# Patient Record
Sex: Female | Born: 1940 | Race: White
Health system: Southern US, Community
[De-identification: ages and names within clinical notes are randomized; demographics above are authoritative.]

## PROBLEM LIST (undated history)

## (undated) ENCOUNTER — Ambulatory Visit (INDEPENDENT_AMBULATORY_CARE_PROVIDER_SITE_OTHER): Admission: RE | Payer: Self-pay

## (undated) DIAGNOSIS — H04123 Dry eye syndrome of bilateral lacrimal glands: Secondary | ICD-10-CM

## (undated) DIAGNOSIS — M545 Low back pain, unspecified: Secondary | ICD-10-CM

## (undated) DIAGNOSIS — E785 Hyperlipidemia, unspecified: Secondary | ICD-10-CM

## (undated) DIAGNOSIS — M1990 Unspecified osteoarthritis, unspecified site: Secondary | ICD-10-CM

## (undated) DIAGNOSIS — IMO0002 Reserved for concepts with insufficient information to code with codable children: Secondary | ICD-10-CM

## (undated) DIAGNOSIS — R87619 Unspecified abnormal cytological findings in specimens from cervix uteri: Secondary | ICD-10-CM

## (undated) HISTORY — DX: Reserved for concepts with insufficient information to code with codable children: IMO0002

## (undated) HISTORY — DX: Unspecified abnormal cytological findings in specimens from cervix uteri: R87.619

## (undated) HISTORY — DX: Unspecified osteoarthritis, unspecified site: M19.90

## (undated) HISTORY — DX: Hyperlipidemia, unspecified: E78.5

## (undated) HISTORY — DX: Dry eye syndrome of bilateral lacrimal glands: H04.123

## (undated) HISTORY — PX: COLPOSCOPY: SHX161

---

## 2008-12-19 ENCOUNTER — Encounter (INDEPENDENT_AMBULATORY_CARE_PROVIDER_SITE_OTHER): Payer: Self-pay

## 2008-12-26 ENCOUNTER — Ambulatory Visit
Admit: 2008-12-26 | Disposition: A | Discharge status: Home / Self Care | Payer: Self-pay | Source: Ambulatory Visit | Admitting: Surgery

## 2008-12-26 LAB — CBC
Hematocrit: 37 % (ref 37.0–47.0)
Hgb: 12.4 G/DL (ref 12.0–16.0)
MCH: 31.2 PG (ref 28.0–32.0)
MCHC: 33.5 G/DL (ref 32.0–36.0)
MCV: 93.2 FL (ref 80.0–100.0)
MPV: 12 FL (ref 9.4–12.3)
Platelets: 231 /mm3 (ref 140–400)
RBC: 3.97 /mm3 — ABNORMAL LOW (ref 4.20–5.40)
RDW: 12.5 % (ref 11.5–15.0)
WBC: 8.47 /mm3 (ref 3.50–10.80)

## 2008-12-26 LAB — BASIC METABOLIC PANEL
BUN: 19 mg/dL (ref 8–20)
CO2: 29 mEq/L (ref 21–30)
Calcium: 9.5 mg/dL (ref 8.6–10.2)
Chloride: 102 mEq/L (ref 98–107)
Creatinine: 1 mg/dL (ref 0.6–1.5)
Glucose: 80 mg/dL (ref 70–100)
Potassium: 4.7 mEq/L (ref 3.6–5.0)
Sodium: 136 mEq/L (ref 136–146)

## 2008-12-26 LAB — GFR

## 2009-01-04 ENCOUNTER — Ambulatory Visit: Admission: RE | Admit: 2009-01-04 | Payer: Self-pay | Source: Ambulatory Visit | Admitting: Surgery

## 2009-01-04 ENCOUNTER — Encounter (INDEPENDENT_AMBULATORY_CARE_PROVIDER_SITE_OTHER): Payer: Self-pay

## 2009-01-04 HISTORY — PX: BREAST SURGERY: SHX581

## 2011-04-17 ENCOUNTER — Ambulatory Visit (INDEPENDENT_AMBULATORY_CARE_PROVIDER_SITE_OTHER)
Admit: 2011-04-17 | Discharge: 2011-04-17 | Disposition: A | Discharge status: Home / Self Care | Payer: Self-pay | Source: Ambulatory Visit

## 2011-07-12 LAB — ECG 12-LEAD
Atrial Rate: 72 {beats}/min
P Axis: 86 degrees
P-R Interval: 152 ms
Q-T Interval: 368 ms
QRS Duration: 94 ms
QTC Calculation (Bezet): 402 ms
R Axis: 73 degrees
T Axis: 54 degrees
Ventricular Rate: 72 {beats}/min

## 2011-08-08 NOTE — Op Note (Unsigned)
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            INDICATIONS:      The patient presented after undergoing a stereotactic breast biopsy of the      upper and lower left breast.  The biopsy in the upper breast revealed an      atypical ductal hyperplasia.  Options of care were discussed and we decided      to go forward with an excisional breast biopsy due to the chance of      upstaging to malignancy of 10 to 20%.  We discussed placing the guidewire      in the OR with an ultrasound to localize the hematoma cavity that was      present on clinical exam.  The patient was advised that we may not get the      clip as when there is a significant hematoma clips often will migrate and      the hematoma is a more reliable target.  We discussed the procedure in      detail as well as the expected outcome and potential risks.  Specifically      stressed was the possibility of missing an index lesion.            OPERATIVE FINDINGS:      Specimen mammogram did not contain the clip but there was an obvious      hematoma cavity that was partially entered in a fair amount of blood was      aspirated.  I did fully excise the hematoma cavity.            COMPLICATIONS:      None.            ESTIMATED BLOOD LOSS:      Minimal.            DESCRIPTION OF PROCEDURE:      The patient was sedated and  the upper left breast was scanned, hematoma      located, and ultrasound guidance was used to place a wire through it.  The      left breast was now prepped and draped in the usual sterile fashion.  Time      out was done.  The time out was done after draping.  Area around the tip of      the wire was now anesthetized with 1% Xylocaine with epinephrine.  An  incision was made at the areolar border.  Flaps were raised to unroof the      breast tissue around the hematoma cavity, which was now removed completely.       The hematoma was entered cranially and some old blood was aspirated.      Specimen was oriented for pathologic study and sent for mammogram but did      not reveal the clip.  Hemostasis was established with electrocautery.            The wound was irrigated with normal saline.  Palpation of the wound      revealed nothing of concern in the surrounding breast tissue.  Wound was      closed in layers using a 3-0 Vicryl for the subcutaneous layer and a      running 4-0 Monocryl for the skin.  Marcaine was injected for patient      comfort.  Benzoin, Steri-Strips, and dry sterile dressings were applied.      The patient tolerated the procedure well, left the operating room in stable      condition.                              _______________________________     Date/Time Signed: _____________      Randolph Bing MD ([id number])            D:  01/04/2009 13:05 PM by Dr. Elana Alm. Noel Gerold, MD ([id number])      T:  01/04/2009 15:51 PM by [id number]          Everlean Cherry: [id number]) (Doc ID: [id number])                  HQ:IONGE Aron Baba MD      Alecia Lemming MD

## 2012-02-18 ENCOUNTER — Encounter (INDEPENDENT_AMBULATORY_CARE_PROVIDER_SITE_OTHER): Payer: Self-pay | Admitting: Surgery

## 2012-03-31 ENCOUNTER — Encounter (INDEPENDENT_AMBULATORY_CARE_PROVIDER_SITE_OTHER): Payer: Self-pay

## 2012-04-05 ENCOUNTER — Encounter (INDEPENDENT_AMBULATORY_CARE_PROVIDER_SITE_OTHER): Payer: Self-pay

## 2012-04-07 ENCOUNTER — Ambulatory Visit (INDEPENDENT_AMBULATORY_CARE_PROVIDER_SITE_OTHER): Payer: BC Managed Care – PPO | Admitting: Surgical Oncology

## 2012-04-07 ENCOUNTER — Encounter (INDEPENDENT_AMBULATORY_CARE_PROVIDER_SITE_OTHER): Payer: Self-pay | Admitting: Surgical Oncology

## 2012-04-07 VITALS — BP 110/61 | HR 72 | Temp 97.7°F | Ht 63.5 in | Wt 146.0 lb

## 2012-04-07 DIAGNOSIS — N6099 Unspecified benign mammary dysplasia of unspecified breast: Secondary | ICD-10-CM

## 2012-04-07 DIAGNOSIS — N6029 Fibroadenosis of unspecified breast: Secondary | ICD-10-CM

## 2012-04-08 ENCOUNTER — Encounter (INDEPENDENT_AMBULATORY_CARE_PROVIDER_SITE_OTHER): Payer: Self-pay | Admitting: Surgical Oncology

## 2012-04-08 DIAGNOSIS — N6029 Fibroadenosis of unspecified breast: Secondary | ICD-10-CM | POA: Insufficient documentation

## 2012-04-08 DIAGNOSIS — N6099 Unspecified benign mammary dysplasia of unspecified breast: Secondary | ICD-10-CM | POA: Insufficient documentation

## 2012-04-08 NOTE — Progress Notes (Signed)
Subjective:       Patient ID: Carolyn [name] is a 71 y.o. female.    HPI    Ms. Marcell presents for a clinical breast exam and review of her recent imaging studies.   She was diagnosed with atypical ductal hyperplasia at the time of a core biopsy in March 2009.   She had a excisional biopsy which failed to show residual atypia.   She is not interested in adding breast MRI to her surveillance or taking tamoxifen to reduce her breast cancer risk.   There is no family history of breast cancer.     She denies any breast changes or concerns.   She had a bilateral mammogram on 03/31/12 at [facility] which was noted to be stable.     Review of Systems    Significant for occasional night sweats.  Has cataracts.  The patient denies difficulties with fevers, chills, arthralgias, myalgias, appetite changes or change in bowel or bladder function.  She denies headaches, visual disturbances, dyspnea or chest discomfort.  All other symptoms negative        Objective:    Physical Exam     GENERAL:  Well developed female in no acute distress  HEENT:  No scleral icterus is noted.  LYMPHATICS:  There is no palpable cervical, supraclavicular or axillary adenopathy.  BREAST EXAMINATION:  The patient is examined in the upright and supine positions.  Both nipple are everted.  The breast tissue is noted to be mildly dense bilaterally.  There are no palpable masses or areas of focal tenderness.    CHEST:  The chest is clear to auscultation and percussion.  CARDIAC EXAM:  There is a regular rate and rhythm without murmurs, rubs or gallops.  ABDOMEN:  The abdomen is without distention and is nontender.  There is no palpable hepatosplenomegaly.   EXTREMITIES: Full range of motion of all extremities.  No evidence of  erythema or edema.     Radiologic Studies:  Her mammogram films were reviewed.  The breast tissue was noted to be heterogeneously dense mammographically.  Benign appearing calcifications noted.   The films were reviewed with the patient  while in the office today.          Assessment:       Atypical ductal hyperplasia  Dense breast tissue       Plan:       There are suspicious findings on her clinical exam or mammogram.   She will continue with her own self breast exam and call if she notes a change.  Otherwise, she will return in one year for an exam and mammogram.

## 2012-04-28 ENCOUNTER — Ambulatory Visit: Payer: BC Managed Care – PPO | Admitting: Obstetrics & Gynecology

## 2012-04-28 ENCOUNTER — Encounter: Payer: Self-pay | Admitting: Obstetrics & Gynecology

## 2012-04-28 VITALS — BP 124/68 | HR 69 | Ht 63.0 in | Wt 146.0 lb

## 2012-04-28 DIAGNOSIS — Z01419 Encounter for gynecological examination (general) (routine) without abnormal findings: Secondary | ICD-10-CM

## 2012-04-28 MED ORDER — ESTRADIOL-NORETHINDRONE ACET 0.5-0.1 MG PO TABS
1.00 | ORAL_TABLET | Freq: Every day | ORAL | Status: DC
Start: 2012-04-28 — End: 2013-04-19

## 2012-04-28 NOTE — Progress Notes (Signed)
Subjective:       Carolyn [name] is a 71 y.o. female here for a routine exam.  Current complaints: none.  Personal health questionnaire reviewed: yes.     Gynecologic History  Patient's last menstrual period was 01/19/1999.  Contraception: post menopausal status  Last Pap: 03/06/2011. Results were: normal, HPV neg  Last mammogram: 03/2012. Results were: normal (ordered by her breast surgeon Dr Noel Gerold)    Obstetric History  OB History     Grav Para Term Preterm Abortions TAB SAB Ect Mult Living    1 1               # Outc Date GA Lbr Len/2nd Wgt Sex Del Anes PTL Lv    1 PAR 9/76     SVD               The following portions of the patient's history were reviewed and updated as appropriate: allergies, current medications, past family history, past medical history, past social history, past surgical history and problem list.    Review of Systems  A comprehensive review of systems was negative except for: Musculoskeletal: positive for osteoarthritis left knee and cataract in her left eye that she plans surgery for in 05/2012     Objective:        BP 124/68  Pulse 69  Ht 1.6 m (5\' 3" )  Wt 66.225 kg (146 lb)  BMI 25.86 kg/m2  LMP 01/19/1999    General Appearance:  Alert, cooperative, no distress, appears stated age   Head:  Normocephalic, without obvious abnormality, atraumatic   Eyes:  PERRL, conjunctiva/corneas clear, EOM's intact        Nose: Nares normal, septum midline,mucosa normal, no drainage or sinus tenderness   Throat: Lips, mucosa, and tongue normal; teeth and gums normal   Neck: Supple, symmetrical, trachea midline, no adenopathy;  thyroid: not enlarged, symmetric, no tenderness/mass/nodules   Back:   Symmetric, no curvature, ROM normal, no CVA tenderness   Lungs:   Clear to auscultation bilaterally, respirations unlabored   Breasts:  No masses or tenderness   Heart:  Regular rate and rhythm, S1 and S2 normal, no murmur, rub, or gallop   Abdomen:   Soft, non-tender, bowel sounds active all four quadrants,  no  masses, no organomegaly       Extremities: Extremities normal, atraumatic, no cyanosis or edema       Skin: Skin color, texture, turgor normal, no rashes or lesions   Lymph nodes: Cervical, supraclavicular, and axillary nodes normal   Neurologic: Normal     Pelvic: External genitalia: normal general appearance  Vaginal: normal without tenderness, induration or masses  Cervix: normal appearance  Adnexa: normal bimanual exam and no palpable masses  Uterus: normal size nontender     Patient reports normal colonoscopy 2 months ago, no polyps  GI advised her to return in 5 years for repeat colonoscopy          Assessment:      Healthy female exam.      Plan:      Mammogram ordered.  Follow up in: 1 year.

## 2012-05-01 LAB — PAP IG, RFX HPV ASCU: .: 0

## 2012-10-20 HISTORY — PX: COLONOSCOPY, DIAGNOSTIC (SCREENING): SHX174

## 2013-04-01 ENCOUNTER — Telehealth (INDEPENDENT_AMBULATORY_CARE_PROVIDER_SITE_OTHER): Payer: Self-pay

## 2013-04-01 NOTE — Telephone Encounter (Signed)
04/01/13- Called pt and left a voicemail. Due for f/u appt and imaging with Dr. Cohen. SB

## 2013-04-14 ENCOUNTER — Encounter (INDEPENDENT_AMBULATORY_CARE_PROVIDER_SITE_OTHER): Payer: Self-pay

## 2013-04-19 ENCOUNTER — Other Ambulatory Visit: Payer: Self-pay | Admitting: Obstetrics & Gynecology

## 2013-04-28 ENCOUNTER — Ambulatory Visit: Payer: BC Managed Care – PPO | Admitting: Obstetrics & Gynecology

## 2013-04-28 ENCOUNTER — Encounter: Payer: Self-pay | Admitting: Obstetrics & Gynecology

## 2013-04-28 VITALS — BP 124/68 | Ht 64.0 in | Wt 148.0 lb

## 2013-04-28 DIAGNOSIS — Z01419 Encounter for gynecological examination (general) (routine) without abnormal findings: Secondary | ICD-10-CM

## 2013-04-28 MED ORDER — ESTRADIOL-NORETHINDRONE ACET 0.5-0.1 MG PO TABS
1.0000 | ORAL_TABLET | Freq: Every day | ORAL | Status: DC
Start: 2013-04-28 — End: 2014-05-11

## 2013-04-28 MED ORDER — OXYBUTYNIN CHLORIDE ER 10 MG PO TB24
10.0000 mg | ORAL_TABLET | Freq: Every day | ORAL | Status: DC
Start: 2013-04-28 — End: 2014-05-24

## 2013-04-28 NOTE — Progress Notes (Signed)
Subjective:       Carolyn [name] is a 72 y.o. female here for a routine exam.  Current complaints: still has urge incontinence but is much better with Detrol. She has noticed dry mouth and itching scalp and wants to try an alternative medication. Will try Ditropan XL 10 mg daily.  Personal health questionnaire reviewed: yes.     Gynecologic History  Patient's last menstrual period was 01/19/1999.  Contraception: post menopausal status  Last Pap: 03/2012. Results were: normal  Last mammogram: 03/2013. Results were: normal by patient's report (orders are from Dr Noel Gerold due to history of atypical ductal hyperplasia)    Obstetric History  OB History     Grav Para Term Preterm Abortions TAB SAB Ect Mult Living    1 1               # Outc Date GA Lbr Len/2nd Wgt Sex Del Anes PTL Lv    1 PAR 9/76     SVD               The following portions of the patient's history were reviewed and updated as appropriate: allergies, current medications, past family history, past medical history, past social history, past surgical history and problem list.    Review of Systems  A comprehensive review of systems was negative. No new medical problems.     Objective:      124/68, 148 lbs     General appearance: alert, appears stated age and cooperative  Neck: no adenopathy, supple, symmetrical, trachea midline and thyroid not enlarged, symmetric, no tenderness/mass/nodules  Lungs: clear to auscultation bilaterally  Breasts: normal appearance, no masses or tenderness, No nipple retraction or dimpling  Heart: regular rate and rhythm  Abdomen: soft, non-tender; bowel sounds normal; no masses,  no organomegaly  Extremities: extremities normal, atraumatic, no cyanosis or edema  Pelvic Exam:   Vulva:  normal   Vagina: normal vagina, no discharge, exudate, lesion, or erythema   Cervix:  no cervical motion tenderness and no lesions   Corpus: normal size, contour, position, consistency, mobility, non-tender   Adnexa:  no mass, fullness, tenderness        TP Pap performed  Had colonoscopy last year  Discussed ACOG recommendations for Paps, She would like to continue with Paps annually as long as her insurance will cover it.          Assessment:      Healthy female exam.      Plan:      Follow up in: 1 year.  Mammogram orders per Dr Noel Gerold

## 2013-05-02 LAB — PAP IG, RFX HPV ASCU: .: 0

## 2013-05-16 ENCOUNTER — Other Ambulatory Visit: Payer: Self-pay | Admitting: Obstetrics & Gynecology

## 2013-05-23 ENCOUNTER — Ambulatory Visit (INDEPENDENT_AMBULATORY_CARE_PROVIDER_SITE_OTHER): Payer: BC Managed Care – PPO | Admitting: Surgery

## 2013-05-23 ENCOUNTER — Encounter (INDEPENDENT_AMBULATORY_CARE_PROVIDER_SITE_OTHER): Payer: Self-pay | Admitting: Surgery

## 2013-05-23 VITALS — BP 137/61 | HR 81 | Temp 98.5°F | Wt 148.0 lb

## 2013-05-23 DIAGNOSIS — N6092 Unspecified benign mammary dysplasia of left breast: Secondary | ICD-10-CM

## 2013-05-23 NOTE — Progress Notes (Signed)
HPI:  This 72 y.o. female returns for breast cancer screening because of a previous biopsy revealing atypical ductal hyperplasia.  Carolyn [name] was initially seen in 03/10 for atypical ductal hyperplasia noted on a stereotactic breast biopsy done for calcifications I did an excisional breast biopsy that reveal no evidence of malignancy.  We opted to follow up with yearly mammogram with tomosynthesis and clinical breast exam. No change in breast self exam is reported.  Specifically denied are: new masses, focal pain, nipple discharge, bleeding from the nipple and skin changes.  Last office visit of 06/13 was reviewed and revealed:  Nothing of concern   Mammogram of 06/14 was reviewed and revealed: BIRADS category 2 benign changes                          PHYSICAL EXAM:                    WDWN female in NAD  HEENT:  Clear, no scleral icterus, neck supple  Neck:  No thyromegaly or masses, trachea midline  Chest:  Clear to auscultation, respiratory effort normal  CV:  RRR without murmurs or gallops, no pedal edema  Abd:  No tenderness, organomegaly or masses  BJM:  Gait and station normal  Ext:  No CCE  Skin:  Free of significant ulcers or lesions  Neuro:  Grossly intact, no focal findings, alert and oriented, asks appropriate questions  LN:  No axillary, supraclavicular or cervical adenopathy  Breast:  Symmetric, without dominant masses, focal tenderness, skin changes or nipple discharge.           ASSESSMENT:  Clinical breast exam and mammogram reveal nothing of concern.          PLAN:  Yearly mammogram with tomosynthesis and breast cancer surveillance.      FOLLOW UP:  Return for clinical breast exam and review of mammogram in 1 year.

## 2013-09-22 ENCOUNTER — Encounter: Payer: Self-pay | Admitting: Obstetrics & Gynecology

## 2013-09-22 ENCOUNTER — Ambulatory Visit: Payer: BC Managed Care – PPO | Admitting: Obstetrics & Gynecology

## 2013-09-22 VITALS — BP 132/66

## 2013-09-22 DIAGNOSIS — N950 Postmenopausal bleeding: Secondary | ICD-10-CM

## 2013-09-22 NOTE — Progress Notes (Signed)
Gyn Office Visit Note    Date September 22, 2013  Patient Name: Carolyn [name]  Attending Physician: Dr Brien Few    Subjective:     72 yo female patient who presents with postmenopausal bleeding. She is on HRT and her medications have not changed. She has not had any spotting or bleeding in the past. She reports that about 1 week ago before Thanksgiving, she had brown spotting and some RLQ "twinges." She denies any significant abd or pelvic pain. She states that 3 days ago she noticed red bleeding that required a minipad but not heavy. Today she has brown spotting.     ROS: positive for abd discomfort "I thought it was my gallbladder, " she had a GI workup including endoscopy and upper GI. Findings were normal except for a small hiatal hernia by her report.     Medications:         Ditropan XL  Activella 0.5/1  Arthrotec  Simvastatin      Objective:     Filed Vitals:    09/22/13 1321   BP: 132/66       Abdomen: Abdomen soft, non-tender. BS normal. No masses,  No organomegaly  Procedure      The endometrial biopsy procedure with associated risks and benefits were discussed with the patient.  Consent was obtained.     The patient was placed in the dorsal lithotomy position with legs placed in the stirrups. After a bimanual examination to assess the uterine size and position, a vaginal speculum was placed in the vagina providing adequate visualization of the cervix. The cervix was cleaned and prepped with betadine in the normal sterile fashion. No cervical lesions were visualized. Brown blood was noted from os. No Allis clamp was required for traction. The Milex EMB device was guided through the cervical os and to the uterine fundus without difficulty. The uterus was sounded to 7 cm. Syringe suction was applied and the biopsy sample was obtained with gentle rotation and extraction of the device from the uterus.  A small amount of tissue was collected. Excellent hemostasis was noted at completion of procedure. No  complications were noted. EBL was minimal.   The patient tolerated the procedure well. The patient was counseled regarding post-procedure precautions. The patient was advised to return for bleeding similar to a period for 2 or more days, purulent/malodorous vaginal discharge, temperature >100.78F, or abdominal pain not controlled with OTC medications. The patient will be contacted upon receipt of her pathology results.       Labs  EMB to pathology    Radiologic Studies  [facility] order for pelvic sonogram given to patient    Assessment:     Patient Active Problem List   Diagnosis   . Benign mammary dysplasia, unspecified   . Fibroadenosis of breast   Pmp bleeding     Plan:     72 yo female patient who presents with pmp bleeding on HRT  EMB performed  Pelvic sonogram ordered  Will call patient with results.

## 2013-09-27 ENCOUNTER — Encounter: Payer: Self-pay | Admitting: Obstetrics & Gynecology

## 2013-09-27 LAB — TISSUE EXAM

## 2013-09-27 LAB — US PELVIS WITH TRANSVAGINAL

## 2013-09-28 ENCOUNTER — Telehealth: Payer: Self-pay | Admitting: Obstetrics & Gynecology

## 2013-09-28 MED ORDER — PROGESTERONE MICRONIZED 200 MG PO CAPS
200.0000 mg | ORAL_CAPSULE | Freq: Every evening | ORAL | Status: AC
Start: 2013-09-28 — End: 2013-10-12

## 2013-09-28 NOTE — Progress Notes (Signed)
Quick Note:    Patient informed that her endometrial biopsy is benign. She she is still spotting, will treat "weakly proliferative endometrium" with a short course of progesterone. She is on HRT and she is aware that she may need to discontinue HRT if bleeding persists or consider a hysteroscopy. She will call back if the progesterone course does not resolve the spotting.  ______

## 2013-09-28 NOTE — Telephone Encounter (Signed)
Patient informed of her EMB and pelvic sono results on 10/08/2013. Since she is still having spotting on HRT, will try a short course of progesterone (EMB was weakly proliferative). If no effect she needs to consider discontinuing HRT or hysteroscopy as was discussed with her today.

## 2013-09-28 NOTE — Progress Notes (Signed)
Quick Note:    Patient informed on 10/08/2013 that she has 2 small fibroids and normal endometrial thickness.  ______

## 2013-10-10 ENCOUNTER — Telehealth: Payer: Self-pay

## 2013-10-10 NOTE — Telephone Encounter (Signed)
Patient called stating she is still spotting and would like to proceed with the hysteroscopy. Scheduled for 10/21/13.

## 2013-10-10 NOTE — Telephone Encounter (Signed)
She will need to sign consent, have blood work drawn (CBC, CMP, TSH abnormal reflex), and she will need an EKG with her primary MD.  Then she will need preop instructions

## 2013-10-11 NOTE — Telephone Encounter (Signed)
Patient notified. She will come 10/14/13 for blood work, sign consent and pick up pre-op packet. She is calling her PCP right now to schedule the EKG.

## 2013-10-19 ENCOUNTER — Encounter: Payer: Self-pay | Admitting: Obstetrics & Gynecology

## 2013-10-20 ENCOUNTER — Encounter: Payer: Self-pay | Admitting: Obstetrics & Gynecology

## 2013-10-20 HISTORY — PX: DILATION AND CURETTAGE OF UTERUS: SHX78

## 2013-10-21 ENCOUNTER — Ambulatory Visit: Payer: Self-pay

## 2013-10-21 ENCOUNTER — Inpatient Hospital Stay
Admission: RE | Admit: 2013-10-21 | Discharge: 2013-10-21 | Disposition: A | Discharge status: Home / Self Care | Payer: BC Managed Care – PPO | Source: Ambulatory Visit | Attending: Obstetrics & Gynecology | Admitting: Obstetrics & Gynecology

## 2013-10-21 DIAGNOSIS — N950 Postmenopausal bleeding: Secondary | ICD-10-CM

## 2013-10-26 NOTE — Progress Notes (Signed)
Provera 5mg  1poqd #90 w/ 0 Refills called in to [facility] [phone]

## 2014-03-05 ENCOUNTER — Encounter: Payer: Self-pay | Admitting: Obstetrics & Gynecology

## 2014-05-02 ENCOUNTER — Encounter: Payer: Self-pay | Admitting: Obstetrics & Gynecology

## 2014-05-03 ENCOUNTER — Ambulatory Visit: Payer: BC Managed Care – PPO | Admitting: Obstetrics & Gynecology

## 2014-05-03 ENCOUNTER — Encounter: Payer: Self-pay | Admitting: Obstetrics & Gynecology

## 2014-05-03 VITALS — BP 130/74 | Wt 147.0 lb

## 2014-05-03 DIAGNOSIS — Z01419 Encounter for gynecological examination (general) (routine) without abnormal findings: Secondary | ICD-10-CM

## 2014-05-03 NOTE — Progress Notes (Signed)
Subjective:       Carolyn [name] is a 73 y.o. female here for a routine exam.  Current complaints: none.  Personal health questionnaire reviewed: yes.     Gynecologic History  Patient's last menstrual period was 01/19/1999.  Contraception: post menopausal status  Last Pap: 04/2013. Results were: normal  Last mammogram: 05/2013. Results were: normal   She has a history of left breast biopsy for atypical ductal hyperplasia and she sees breast surgeon Dr Noel Gerold. He orders her breast imaging studies.     Obstetric History  OB History   Gravida Para Term Preterm AB SAB TAB Ectopic Multiple Living   1 1              # Outcome Date GA Lbr Len/2nd Weight Sex Delivery Anes PTL Lv   1 Para 06/24/75     Vag-Spont               The following portions of the patient's history were reviewed and updated as appropriate: allergies, current medications, past family history, past medical history, past social history, past surgical history and problem list.    Review of Systems  A comprehensive review of systems was negative except for: Musculoskeletal: positive for lumbar back and hip pain that has been treated with cortisone injections but she does not feel that this is working. She is seeing her spine doctor for follow up Dr Loreta Ave. And Positive for GI: she had an EGD within the last year after 2 bouts of "extreme nausea" and was told that the findings were for gastritis      Objective:   LMP 01/19/1999, BP 130/74, 147 lbs        General appearance: alert, appears stated age and cooperative  Neck: no adenopathy, supple, symmetrical, trachea midline and thyroid not enlarged, symmetric, no tenderness/mass/nodules  Lungs: clear to auscultation bilaterally  Breasts: normal appearance, no masses or tenderness, No nipple retraction or dimpling  Heart: regular rate and rhythm  Abdomen: soft, non-tender; bowel sounds normal; no masses,  no organomegaly  Extremities: extremities normal, atraumatic, no cyanosis or edema  Pelvic Exam:   Vulva:   normal   Vagina: normal vagina, no discharge, exudate, lesion, or erythema   Cervix:  no cervical motion tenderness and no lesions   Corpus: normal size, contour, position, consistency, mobility, non-tender   Adnexa:  no mass, fullness, tenderness       TP Pap performed  Patient has recent history of postmenopausal bleeding within last year with 10/2013 D&C for benign cells.             Assessment:      Healthy female exam. Risks of HRT reviewed with patient including but not limited to thromboembolic, cardiac, and cerebrovascular events, or hormone sensitive cancers that may be life threatening. She plans to continue HRT despite these risks. Her questions were answered.      Plan:      Mammogram ordered.  Follow up in: 1 year.

## 2014-05-05 LAB — PAP IG, HPV-HR PROFILE
.: 0
HPV, high-risk: NEGATIVE

## 2014-05-08 NOTE — Progress Notes (Signed)
Quick Note:    Pap postcard mailed to patient.  ______

## 2014-05-11 ENCOUNTER — Other Ambulatory Visit: Payer: Self-pay | Admitting: Obstetrics & Gynecology

## 2014-05-24 ENCOUNTER — Encounter (INDEPENDENT_AMBULATORY_CARE_PROVIDER_SITE_OTHER): Payer: Self-pay | Admitting: Surgery

## 2014-05-24 ENCOUNTER — Ambulatory Visit (INDEPENDENT_AMBULATORY_CARE_PROVIDER_SITE_OTHER): Payer: BC Managed Care – PPO | Admitting: Adult Health

## 2014-05-24 ENCOUNTER — Ambulatory Visit (INDEPENDENT_AMBULATORY_CARE_PROVIDER_SITE_OTHER): Payer: BC Managed Care – PPO | Admitting: Surgery

## 2014-05-24 VITALS — BP 123/60 | HR 68 | Temp 98.8°F | Wt 145.0 lb

## 2014-05-24 DIAGNOSIS — N6099 Unspecified benign mammary dysplasia of unspecified breast: Secondary | ICD-10-CM

## 2014-05-24 DIAGNOSIS — N6089 Other benign mammary dysplasias of unspecified breast: Secondary | ICD-10-CM

## 2014-05-24 NOTE — Progress Notes (Signed)
HPI:  This 73 y.o. female returns for breast cancer screening because of a previous biopsy revealing atypical ductal hyperplasia.  Carolyn [name] was initially seen in 03/10 for atypical ductal hyperplasia noted on a stereotactic breast biopsy done for calcifications.  I did an excisional breast biopsy that reveal no evidence of malignancy.  We opted to follow up with yearly mammogram with tomosynthesis and clinical breast exam. No change in breast self exam is reported.  Specifically denied are: new masses, focal pain, nipple discharge, bleeding from the nipple and skin changes.  Last office visit of 08/14 was reviewed and revealed:  Nothing of concern   Mammogram of 07/15 was reviewed and revealed: BIRADS category 2 benign changes                          PHYSICAL EXAM:                    WDWN female in NAD  HEENT:  Clear, no scleral icterus, neck supple  Neck:  No thyromegaly or masses, trachea midline  Chest:  Clear to auscultation, respiratory effort normal  CV:  RRR without murmurs or gallops, no pedal edema  Abd:  No tenderness, organomegaly or masses  BJM:  Gait and station normal  Ext:  No CCE  Skin:  Free of significant ulcers or lesions  Neuro:  Grossly intact, no focal findings, alert and oriented, asks appropriate questions  LN:  No axillary, supraclavicular or cervical adenopathy  Breast:  Examined in upright and supine positions.  Both nipples everted.  Symmetric, without dominant masses, focal tenderness, skin changes or nipple discharge. Mild tenderness to the lower inner left breast.          ASSESSMENT:  Clinical breast exam and mammogram reveal nothing of concern.          PLAN:  Yearly mammogram with tomosynthesis and breast cancer surveillance.      FOLLOW UP:  Return for clinical breast exam and review of mammogram in 1 year.

## 2014-05-24 NOTE — Progress Notes (Signed)
Have you sought care outside of [place] since we last saw you?   Yes

## 2014-07-05 ENCOUNTER — Ambulatory Visit: Payer: BC Managed Care – PPO | Admitting: Obstetrics & Gynecology

## 2014-07-05 VITALS — BP 134/82 | Ht 64.0 in | Wt 144.0 lb

## 2014-07-05 DIAGNOSIS — R1900 Intra-abdominal and pelvic swelling, mass and lump, unspecified site: Secondary | ICD-10-CM

## 2014-07-05 HISTORY — DX: Intra-abdominal and pelvic swelling, mass and lump, unspecified site: R19.00

## 2014-07-05 NOTE — Progress Notes (Signed)
Gyn Office Visit Note    Date July 05, 2014  Patient Name: Carolyn [name]  Attending Physician: Dr Brien Few    Subjective:     73 yo female patient who presents to office to discuss pelvic finding noted on MRI performed to evaluate her left hip.  ROS: + for dyspepsia - has seen GI and had colonoscopy, EGD and barium testing that were all normal by her report other than gastritis, had D&C 10/2013 for postmenopausal bleeding with normal pathology - inactive endometrium and normal endocervical cells with fibrotic mullerian polyp noted.      Medications:        Celebrex  Mimvey  Zocor  Detrol LA           Objective:     Filed Vitals:    07/05/14 1406   BP: 134/82     15 min of face to face contact discussing MRI findings   She had an MRI performed to evaluate her left hip and pelvic findings included small fibroids that were not measured on the report, and a 2cm left pelvic finding that was homogeneous and non-nodular adjacent to sigmoid colon. It is unclear where this 2cm finding originates from although it does have benign features. Since she had a normal pelvic US at [facility] 09/2013 with small fibroids noted, she will have another pelvic US performed there to see if we can delineate where this noted structure is arising from. She does not have any pelvic symptoms other than the postmenopausal bleeding in 10/2013 that was evaluated by hysteroscopy D&C.     Radiologic Studies  [facility] pelvic US ordered    Assessment:     73 yo female patient with 2cm left sided pelvic mass noted on MRI - unclear if ovarian/uterine or other etiology  Pelvic US ordered  Patient Active Problem List   Diagnosis   . Benign mammary dysplasia, unspecified   . Fibroadenosis of breast   . Pelvic mass in female        Plan:     See above

## 2014-07-10 LAB — US PELVIS WITH TRANSVAGINAL

## 2014-07-14 ENCOUNTER — Telehealth: Payer: Self-pay | Admitting: Obstetrics & Gynecology

## 2014-07-14 NOTE — Telephone Encounter (Signed)
Discussed with patient by telephone that her pelvic US did not adequately see her left ovary. Since her 02/2014 MRI commented on a structure near her left adnexa and left sigmoid, will plan on repeat abd/pelvic MRI in 6 months to re-assess the area seen on prior MRI. Patient aware and we will send her the MRI order form.

## 2014-09-05 ENCOUNTER — Ambulatory Visit: Payer: BC Managed Care – PPO | Attending: Obstetrics & Gynecology

## 2014-09-05 ENCOUNTER — Other Ambulatory Visit: Payer: Self-pay | Admitting: Obstetrics & Gynecology

## 2014-09-05 DIAGNOSIS — R1904 Left lower quadrant abdominal swelling, mass and lump: Secondary | ICD-10-CM

## 2014-09-05 DIAGNOSIS — N898 Other specified noninflammatory disorders of vagina: Secondary | ICD-10-CM | POA: Insufficient documentation

## 2014-09-05 DIAGNOSIS — K6289 Other specified diseases of anus and rectum: Secondary | ICD-10-CM | POA: Insufficient documentation

## 2014-09-05 DIAGNOSIS — D250 Submucous leiomyoma of uterus: Secondary | ICD-10-CM | POA: Insufficient documentation

## 2014-09-05 MED ORDER — GADOBUTROL 1 MMOL/ML IV SOLN
7.0000 mL | Freq: Once | INTRAVENOUS | Status: AC | PRN
Start: 2014-09-05 — End: 2014-09-05
  Administered 2014-09-05: 7 mmol via INTRAVENOUS
  Filled 2014-09-05: qty 7.5

## 2015-05-30 ENCOUNTER — Ambulatory Visit (INDEPENDENT_AMBULATORY_CARE_PROVIDER_SITE_OTHER): Payer: BC Managed Care – PPO | Admitting: Surgery

## 2015-05-30 ENCOUNTER — Encounter (INDEPENDENT_AMBULATORY_CARE_PROVIDER_SITE_OTHER): Payer: Self-pay | Admitting: Surgery

## 2015-05-30 VITALS — BP 128/60 | HR 77 | Temp 97.9°F | Wt 144.4 lb

## 2015-05-30 DIAGNOSIS — N6092 Unspecified benign mammary dysplasia of left breast: Secondary | ICD-10-CM | POA: Insufficient documentation

## 2015-05-30 NOTE — Progress Notes (Signed)
HPI:  This 74 y.o. 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  Lisaann Atha was initially seen in 03/10 for atypical ductal hyperplasia noted on a right stereotactic breast biopsy done for calcifications.  I did an excisional right breast biopsy that reveal no evidence of atypia or malignancy.  We opted to follow up with yearly mammogram with tomosynthesis and clinical breast exam. No change in breast self exam is reported.  Specifically denied are: new masses, focal pain, nipple discharge, bleeding from the nipple and skin changes.  Last office visit of 05/24/14 was reviewed and revealed:  Nothing of concern   The bilateral diagnostic mammogram with tomosynthesis done at [facility] on 05/15/15 was reviewed and revealed: BIRADS category 2 benign changes                          PHYSICAL EXAM:                    WDWN female in NAD  HEENT:  Clear, no scleral icterus, neck supple  Neck:  No thyromegaly or masses, trachea midline  Chest:  Clear to auscultation, respiratory effort normal  CV:  RRR without murmurs or gallops, no pedal edema  Abd:  No tenderness, organomegaly or masses  BJM:  Gait and station normal  Ext:  No CCE  Skin:  Free of significant ulcers or lesions  Neuro:  Grossly intact, no focal findings, alert and oriented, asks appropriate questions  LN:  No axillary, supraclavicular or cervical adenopathy  Breast:  Examined in upright and supine positions.  Both nipples everted.  Symmetric, without dominant masses, focal tenderness, skin changes or nipple discharge. Mild tenderness to the lower inner left breast.          ASSESSMENT:  Clinical breast exam and mammogram reveal nothing of concern.          PLAN:  Yearly mammogram with tomosynthesis and breast cancer surveillance.      FOLLOW UP:  Return for clinical breast exam and review of mammogram in 1 year.

## 2015-06-07 ENCOUNTER — Other Ambulatory Visit (INDEPENDENT_AMBULATORY_CARE_PROVIDER_SITE_OTHER): Payer: Self-pay

## 2015-06-07 DIAGNOSIS — N6099 Unspecified benign mammary dysplasia of unspecified breast: Secondary | ICD-10-CM

## 2015-07-25 ENCOUNTER — Other Ambulatory Visit: Payer: Self-pay | Admitting: Orthopaedic Surgery

## 2015-07-25 DIAGNOSIS — M545 Low back pain, unspecified: Secondary | ICD-10-CM

## 2015-07-26 ENCOUNTER — Ambulatory Visit: Payer: BC Managed Care – PPO | Attending: Orthopaedic Surgery

## 2015-07-26 DIAGNOSIS — M47817 Spondylosis without myelopathy or radiculopathy, lumbosacral region: Secondary | ICD-10-CM | POA: Insufficient documentation

## 2015-07-26 DIAGNOSIS — M545 Low back pain, unspecified: Secondary | ICD-10-CM

## 2015-07-26 DIAGNOSIS — M79605 Pain in left leg: Secondary | ICD-10-CM | POA: Insufficient documentation

## 2016-06-20 HISTORY — PX: BUNIONECTOMY: SHX129

## 2016-07-21 ENCOUNTER — Other Ambulatory Visit: Payer: Self-pay | Admitting: Obstetrics & Gynecology

## 2016-08-08 ENCOUNTER — Telehealth (INDEPENDENT_AMBULATORY_CARE_PROVIDER_SITE_OTHER): Payer: Self-pay | Admitting: Surgery

## 2016-08-08 NOTE — Telephone Encounter (Signed)
LVM w/patient regarding her overdue BSM & OV with Dr. Noel Gerold.

## 2017-01-26 ENCOUNTER — Other Ambulatory Visit: Payer: Self-pay | Admitting: Orthopaedic Surgery

## 2017-01-26 DIAGNOSIS — R52 Pain, unspecified: Secondary | ICD-10-CM

## 2017-01-27 ENCOUNTER — Ambulatory Visit: Payer: BC Managed Care – PPO | Attending: Orthopaedic Surgery

## 2017-01-27 DIAGNOSIS — M47812 Spondylosis without myelopathy or radiculopathy, cervical region: Secondary | ICD-10-CM | POA: Insufficient documentation

## 2017-01-27 DIAGNOSIS — M4803 Spinal stenosis, cervicothoracic region: Secondary | ICD-10-CM | POA: Insufficient documentation

## 2017-01-27 DIAGNOSIS — R52 Pain, unspecified: Secondary | ICD-10-CM

## 2017-01-27 DIAGNOSIS — M2578 Osteophyte, vertebrae: Secondary | ICD-10-CM | POA: Insufficient documentation

## 2017-01-27 DIAGNOSIS — M9953 Intervertebral disc stenosis of neural canal of lumbar region: Secondary | ICD-10-CM | POA: Insufficient documentation

## 2017-01-27 DIAGNOSIS — M5386 Other specified dorsopathies, lumbar region: Secondary | ICD-10-CM | POA: Insufficient documentation

## 2017-01-27 DIAGNOSIS — M4802 Spinal stenosis, cervical region: Secondary | ICD-10-CM | POA: Insufficient documentation

## 2017-01-27 DIAGNOSIS — M4316 Spondylolisthesis, lumbar region: Secondary | ICD-10-CM | POA: Insufficient documentation

## 2017-01-27 DIAGNOSIS — M5126 Other intervertebral disc displacement, lumbar region: Secondary | ICD-10-CM | POA: Insufficient documentation

## 2017-01-27 DIAGNOSIS — M47816 Spondylosis without myelopathy or radiculopathy, lumbar region: Secondary | ICD-10-CM | POA: Insufficient documentation

## 2017-01-27 DIAGNOSIS — M5382 Other specified dorsopathies, cervical region: Secondary | ICD-10-CM | POA: Insufficient documentation

## 2017-06-20 HISTORY — PX: JOINT REPLACEMENT: SHX530

## 2018-07-14 ENCOUNTER — Other Ambulatory Visit: Payer: Self-pay | Admitting: Obstetrics & Gynecology

## 2018-08-17 ENCOUNTER — Other Ambulatory Visit: Payer: Self-pay

## 2018-08-17 DIAGNOSIS — M5416 Radiculopathy, lumbar region: Secondary | ICD-10-CM

## 2018-08-18 ENCOUNTER — Ambulatory Visit
Admission: RE | Admit: 2018-08-18 | Discharge: 2018-08-18 | Disposition: A | Discharge status: Home / Self Care | Payer: BC Managed Care – PPO | Source: Ambulatory Visit

## 2018-08-18 ENCOUNTER — Other Ambulatory Visit: Payer: Self-pay

## 2018-08-18 DIAGNOSIS — M5416 Radiculopathy, lumbar region: Secondary | ICD-10-CM

## 2018-08-18 DIAGNOSIS — M48061 Spinal stenosis, lumbar region without neurogenic claudication: Secondary | ICD-10-CM | POA: Insufficient documentation

## 2018-08-20 ENCOUNTER — Other Ambulatory Visit: Payer: Self-pay

## 2018-08-20 DIAGNOSIS — M5416 Radiculopathy, lumbar region: Secondary | ICD-10-CM

## 2018-08-30 ENCOUNTER — Ambulatory Visit: Payer: BC Managed Care – PPO | Attending: Orthopaedic Surgery

## 2018-08-30 NOTE — Pre-Procedure Instructions (Signed)
   Day of Surgery orders faxed to Pharmacy    Per posting/surgeon, patient needs: EKG, CXR, CBC, CMP, PT/PTT, UA, MRSA/MSSA, VITAMIN D.   Pre-op clearance/testing scheduled with PCP on 09/06/2018.   Fax sent to PCP, requesting for pre-op clearance/testing.

## 2018-08-31 ENCOUNTER — Ambulatory Visit
Admission: RE | Admit: 2018-08-31 | Discharge: 2018-08-31 | Disposition: A | Discharge status: Home / Self Care | Payer: BC Managed Care – PPO | Source: Ambulatory Visit | Attending: Orthopaedic Surgery | Admitting: Orthopaedic Surgery

## 2018-08-31 DIAGNOSIS — M9933 Osseous stenosis of neural canal of lumbar region: Secondary | ICD-10-CM | POA: Insufficient documentation

## 2018-08-31 DIAGNOSIS — M4316 Spondylolisthesis, lumbar region: Secondary | ICD-10-CM | POA: Insufficient documentation

## 2018-08-31 DIAGNOSIS — M5136 Other intervertebral disc degeneration, lumbar region: Secondary | ICD-10-CM | POA: Insufficient documentation

## 2018-09-01 LAB — PRESURGICAL SURVEILLANCE, MSSA+MRSA

## 2018-09-06 NOTE — Pre-Procedure Instructions (Signed)
PEC Triage Screening Note: + PEC screening. Chart flagged for liaisons to make phone and in-person NP Eval appointments. E-mail also sent to liaisons to expedite appointments.

## 2018-09-07 ENCOUNTER — Other Ambulatory Visit: Payer: Self-pay | Admitting: Internal Medicine

## 2018-09-08 ENCOUNTER — Telehealth: Payer: BC Managed Care – PPO

## 2018-09-08 NOTE — Pre-Procedure Instructions (Signed)
   Pt states she had a pre-op exam, labs and ekg at her pcp office on 09/06/18- requested by fax   Pt has an appointment at [facility] with the NP on 09/09/18- pt aware of appointment, states she has directions  Patient has not watched spine video or taken test. Video link given to patient and PSS checklist teaching completed.

## 2018-09-09 ENCOUNTER — Encounter: Payer: Self-pay | Admitting: Nurse Practitioner

## 2018-09-09 ENCOUNTER — Ambulatory Visit: Payer: BC Managed Care – PPO

## 2018-09-09 ENCOUNTER — Ambulatory Visit: Payer: BC Managed Care – PPO | Attending: Orthopaedic Surgery

## 2018-09-09 DIAGNOSIS — K219 Gastro-esophageal reflux disease without esophagitis: Secondary | ICD-10-CM

## 2018-09-09 DIAGNOSIS — H04123 Dry eye syndrome of bilateral lacrimal glands: Secondary | ICD-10-CM

## 2018-09-09 DIAGNOSIS — E785 Hyperlipidemia, unspecified: Secondary | ICD-10-CM

## 2018-09-09 NOTE — H&P (Signed)
Carolyn [name], 77 y.o., female, presents to the [facility] clinic for a pre-operative evaluation.    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 Pt has a h/o back issues. She has completed conservative treatments. Has pain all the time and radiates mainly down the L leg. Sxs are worsening. Pt is scheduled for surgery.     Risk Stratification:     Surgical Risk Category:   High Risk    ASA Classification: 2   RCRI:  0   Functional status METS: 5             STOPBANG:  1    Assessment/Plan:    Patient is medically optimized and is at acceptable risk to proceed with the planned surgery.      This assessment will be conveyed to the surgery and anesthesia teams & the patient will be evaluated the morning of surgery.    Patient Active Problem List    Diagnosis Date Noted   . Hyperlipemia 09/09/2018     On med      . Dry eyes 09/09/2018     Uses gtts, f/u with optho     . GERD (gastroesophageal reflux disease) 09/09/2018     Belching a lot, she takes  tylenol for her back.  Sxs are better on the med     . 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 rate 16, height 1.626 m (5\' 4" ), weight 67 kg (147 lb 11.3 oz), last menstrual period 01/19/1999, SpO2 100 %.      Results for orders placed or performed during the hospital encounter of 08/31/18   Presurgical Surveillance, MSSA+MRSA   Result Value Ref Range    Culture Staph and MRSA Surveillance       No Staph aureus isolated and  No Methicillin  Resistant Staph aureus isolated     Presurgical Surveillance, MSSA+MRSA   Result Value Ref Range    Culture Staph and MRSA Surveillance       No Staph aureus isolated and  No Methicillin Resistant Staph aureus isolated        Labs 09/06/18 ~ Vit D 25, CBC  Normal, u/a negative , BS 127, Crt 1.09, GFR 57, PTT, INR, PT normal     EKG 09/06/18 ~ No acute ischemia, inverted T wave in V1 and V2 ~ benign     CXR Sep 07, 2018 ~ No acute process     Anesthesia History:  No issues with anesthesia     Past Medical History:    Past Medical Hx:   Past Medical History:   Diagnosis Date   . Abnormal Pap smear     >20 years ago, colpo, NL since   . Dry eyes     drops daily   . Hyperlipidemia     controlled w/ medication   . Low back pain    . Osteoarthritis     back, knees, hands  Past Surgical Hx:   Past Surgical History:   Procedure Laterality Date   . BREAST SURGERY  01/04/09    Left Stereotactic Biopsy    . BUNIONECTOMY Left 06/2016   . COLONOSCOPY  2014   . COLPOSCOPY      >20 years ago    . DILATION AND CURETTAGE OF UTERUS  10/2013   . JOINT REPLACEMENT Right 06/2017    knee       Family Hx:   Family History   Problem Relation Age of Onset   . Skin cancer Maternal Grandfather    . Breast cancer Neg Hx    . Ovarian cancer Neg Hx    . Cancer Neg Hx      Social Hx:   Social History     Socioeconomic History   . Marital status: Married     Spouse name: None   . Number of children: None   . Years of education: None   . Highest education level: None   Occupational History   . None   Social Needs   . Financial resource strain: None   . Food insecurity:     Worry: None     Inability: None   . Transportation needs:     Medical: None     Non-medical: None   Tobacco Use   . Smoking status: Former Smoker     Packs/day: 2.00     Years: 20.00     Pack years: 40.00     Types: Cigarettes     Last attempt to quit: 1989     Years since quitting: 30.9   . Smokeless tobacco: Never Used   Substance and Sexual Activity   . Alcohol use: Yes      Alcohol/week: 7.0 standard drinks     Types: 7 Glasses of wine per week   . Drug use: No   . Sexual activity: Yes     Partners: Male   Lifestyle   . Physical activity:     Days per week: None     Minutes per session: None   . Stress: None   Relationships   . Social connections:     Talks on phone: None     Gets together: None     Attends religious service: None     Active member of club or organization: None     Attends meetings of clubs or organizations: None     Relationship status: None   . Intimate partner violence:     Fear of current or ex partner: None     Emotionally abused: None     Physically abused: None     Forced sexual activity: None   Other Topics Concern   . None   Social History Narrative   . None       Allergies: No Known Allergies  Current Medications: No current facility-administered medications for this encounter.     Current Outpatient Medications:   .  acetaminophen (TYLENOL) 500 MG tablet, Take 2 tablets by mouth as needed, Disp: , Rfl:   .  cycloSPORINE (RESTASIS) 0.05 % ophthalmic emulsion, Place 1 drop into both eyes 2 (two) times daily, Disp: , Rfl:   .  Estradiol-Norethindrone Acet 0.5-0.1 MG per tablet, Take by mouth daily, Disp: , Rfl:   .  famotidine (PEPCID) 20 MG tablet, Take 20 mg by mouth 2 (two) times daily, Disp: , Rfl:   .  simvastatin (ZOCOR) 20 MG tablet, Take 20  mg by mouth daily  , Disp: , Rfl: 3  .  BIOTIN PO, Take 1,000 mg by mouth daily  , Disp: , Rfl:     History & Physical:    Review of Systems   Constitutional: Negative.    HENT: Negative.    Eyes: Negative.         Has dry eyes   Respiratory: Negative.    Cardiovascular: Negative.    Gastrointestinal: Negative.    Genitourinary: Negative.    Musculoskeletal: Positive for back pain. Negative for joint pain, myalgias and neck pain.   Skin: Negative.    Neurological: Negative.    Endo/Heme/Allergies: Negative.    Psychiatric/Behavioral: Negative.        Physical Exam  Constitutional:       Appearance: Normal appearance.    HENT:      Head: Normocephalic and atraumatic.      Mouth/Throat:      Mouth: Mucous membranes are moist.      Pharynx: Oropharynx is clear. No posterior oropharyngeal erythema.   Eyes:      Pupils: Pupils are equal, round, and reactive to light.   Neck:      Musculoskeletal: Normal range of motion and neck supple.   Cardiovascular:      Rate and Rhythm: Normal rate and regular rhythm.      Heart sounds: Normal heart sounds.   Pulmonary:      Effort: Pulmonary effort is normal.      Breath sounds: Normal breath sounds.   Abdominal:      Palpations: Abdomen is soft.      Tenderness: There is no tenderness. There is no guarding.   Genitourinary:     Comments: Exam deferred  Musculoskeletal: Normal range of motion.         General: No swelling.   Skin:     General: Skin is warm and dry.   Neurological:      General: No focal deficit present.      Mental Status: She is alert and oriented to person, place, and time.   Psychiatric:         Mood and Affect: Mood normal.         Behavior: Behavior normal.         Airway Assessment:   Mallampati score:  3  TM distance:  > 3 FB  Neck ROM: Full   Mouth opening: Full   Dental:   Crowns     Pre op Instructions to Patient:  [facility] standard pre operative instructions reviewed by PSS RN and documented accordingly in EPIC.The following reinforced by me:    1.NPO after 11 pm for cases before 8 am. NPO after MN for cases 8 am-2 pm. NPO 10 hrs before case for cases after 2 pm.  *clear liquids allowed up to 4 hrs before surgical time. Exceptions include: BMI >40, pregnancy > 1st trimester, abnormal/difficult airway, GI pathology, DM w/ gastroparesis/neuropathy, narcotic use. These pt's must be NPO full 8 hrs pre op  2. Medications: As documented in EPIC medication list   3. NSAIDS/SUPPLEMENTS: None for at least 7-10 days pre op  4. Applicable protocols reviewed (ERAS/SPINE/CHG)  5. [facility] Preparing for your surgery and hospital map provided    Patient voiced understanding all questions and  concerns addressed at this time.     Lulu Riding  09/09/2018

## 2018-09-13 NOTE — Discharge Instr - AVS First Page (Signed)
Lumbar Spine Fusion Surgery    Back in Action Spine Surgery booklet to be provided   and reviewed if not previously completed and documented.      Mixing alcohol and pain medications can cause dizziness and slow your breathing.    Don't drink alcoholic beverages while taking pain medications.     Ronald C. Childs , MD   Orthopaedic Spine Surgery   [address]  [place], [place]  [address]    Phone:  [phone]   Fax:  [phone]    Post-operative Instructions Following  Lumbar  Fusion Surgery    EXPECTATIONS OF RECOVERY:  After surgery, you can expect to feel slightly weak and tired, but you should become stronger every day. It is normal to feel some discomfort around your incision, as well as in your arms. There may be some residual numbness and weakness after surgery, this may take months to go away, and may not ever completely go away. Call us if the symptoms suddenly worsen after surgery. The pain will often increase right after surgery and then decrease over time.    ACTIVITY:  . No bending at the waist, twisting or lifting anything greater than 5-10 pounds first several after surgery.   . It is very important to get up and moving at home, but it is equally important to not overdo activities. Plan to have some assistance at home for the first few days. You will be able to walk, take stairs (carefully and slowly), use the bathroom, and get up from sitting to standing using the arms of the chair as support.   . You may feel pressure when having a bowel movement and that is why we recommend using an over-the-counter stool softener for the first few weeks after surgery.   . You can start walking the day you get home from surgery, and slowly increase your activity back to baseline.  Do not bend, twist or lean to pick things up or change positions. Remember good back mechanics and use your legs to sit down and stand up.    PHYSICAL THERAPY:  . We will discuss PT at your first follow up visit. Generally,  PT will not begin until after three months.     DRESSING / WOUND CARE:  . Your wound will be closed with absorbable stitches and covered with steri-strips, gauze and tape.  Change the gauze dressing daily for seven days after surgery.   If you notice any swelling, redness or excessive drainage, call our office at [phone].   Leave the steri strips in place until they fall off themselves. Steri strips are little pieces of tape covering the wound. You may need someone to help you remove the dressing.   . Keep your incision clean and dry at all times.  . Do not apply any lotions, gels, ointments or other products to your incision unless directed.    SHOWERING:  . You may shower on day five after the surgery. If your surgery was Monday, you may shower on Saturday. You do not need to clean the wound with anything more than soap and water.   . You should not let the water hit the incision directly for long periods of time, nor should you soak the incision in a bathtub, hot tub, or pool . When you exit the shower carefully dry the wound with a towel. You may then swipe an alcohol pad over the area to further assure it is completely dry. If the   steri-strips are still in place then you may swipe the alcohol around the strips.     MEDICATION:  . You will be prescribed pain medications and should plan to obtain an over-the-counter stool softener to take while taking the pain medications. . You will have pain after the surgery but will most likely only need to take opioid pain medication for a few weeks.   You should avoid driving or operating any vehicle after taking the opioid pain medication. It can cause drowsiness as well as constipation. Continue taking the stool softener while on pain medicine to avoid constipation.   . If you use all of your pain medication and desire a refill, we will evaluate your situation and you may need a special referral to a pain doctor at that time.   . You may take Tylenol (acetaminophen)  for pain, but keep in mind there may be acetaminophen in your opioid pain medication.  Do not exceed 4 Grams or 4000 mg of acetaminophen per day.   . Do NOT take any NSAIDS ( non-steroidal anti-inflammatory drugs such as Advil, Ibuprofen, Aleve, Motrin, Toradol or Celebrex for at least 6 weeks post-surgery.  We will discuss this further at your post-op visit.    . You may resume your home medications as indicated on your discharge form.  . You should NOT resume your blood thinning medication such as Coumadin, Heparin, Aggrenox, Plavix or any aspirin containing products until instructed to do so by your surgeon.    DRIVING:  . You may resume driving as long as you are no longer taking opioid pain medications.     SMOKING:  . Smoking delays the healing process; we request that you avoid smoking if possible.    EATING AND DRINKING:  . You may resume a normal diet following your procedure.  Please avoid alcoholic beverages while taking pain medication.    SEXUAL ACTIVITY:  . Sexual activity is not advised until you have followed up with us in the office. The jarring mechanism should be avoided for a short time post operatively. After 10 days- 2 weeks you may resume activity with your back supported by the bed/surface.    WORK:  . Do not return to work until you have been advised to do so by staff. Generally we recommend taking 4 weeks off of work for recovery, but each individual situation will be reviewed independently. We do not recommend you return to work until you have stopped taking the opioid pain medication.     HOUSEWORK:  . Avoid vacuuming or laundry until you have followed up in the clinic. The bending and lifting motion can place pressure on your back and neck     WOUND/ STITCHES or STAPLES:  .   If you notice redness, swelling or drainage around your incision, or develop a fever >101.5 F, please call the office immediately [phone] for instructions.    FOLLOW-UP APPOINTMENTS  . You will be given a post  op appointment for two weeks after surgery.   X-rays will be taken .    PROBLEMS or CONCERNS:  Please feel free to call the office [phone] any time for problems, concerns, or questions you may have.  We are available to help you or answer a questions.  In an emergency, please call 911 or go to the nearest Emergency Department. .

## 2018-09-13 NOTE — Pre-Procedure Instructions (Signed)
   Email sent to doc analysts to check [facility] ([facility]) for CXR

## 2018-09-15 NOTE — Pre-Procedure Instructions (Addendum)
   Incoming call from patient to clarify NPO instructions-as recommended by surgeon's office.   NPO instructions from [facility] interview reinforced (as per surg. Guideline)   Expressed verbal understanding

## 2018-09-20 ENCOUNTER — Encounter: Admission: RE | Payer: Self-pay | Source: Ambulatory Visit

## 2018-09-20 ENCOUNTER — Inpatient Hospital Stay
Admission: RE | Admit: 2018-09-20 | Payer: BC Managed Care – PPO | Source: Ambulatory Visit | Admitting: Orthopaedic Surgery

## 2018-09-20 HISTORY — DX: Low back pain, unspecified: M54.50

## 2018-09-20 SURGERY — LAMINECTOMY, POSTERIOR LUMBAR, DECOMPRESSION, FUSION LEVEL 2
Anesthesia: General

## 2018-09-21 ENCOUNTER — Inpatient Hospital Stay
Admission: RE | Admit: 2018-09-21 | Discharge: 2018-09-24 | DRG: 460 | Disposition: A | Discharge status: Home Health | Payer: BC Managed Care – PPO | Source: Ambulatory Visit | Attending: Orthopaedic Surgery | Admitting: Orthopaedic Surgery

## 2018-09-21 ENCOUNTER — Ambulatory Visit: Payer: Self-pay

## 2018-09-21 ENCOUNTER — Encounter
Admission: RE | Disposition: A | Discharge status: Home Health | Payer: Self-pay | Source: Ambulatory Visit | Attending: Orthopaedic Surgery

## 2018-09-21 ENCOUNTER — Inpatient Hospital Stay: Payer: BC Managed Care – PPO

## 2018-09-21 ENCOUNTER — Inpatient Hospital Stay: Payer: BC Managed Care – PPO | Admitting: Nurse Practitioner

## 2018-09-21 DIAGNOSIS — M4316 Spondylolisthesis, lumbar region: Secondary | ICD-10-CM | POA: Diagnosis present

## 2018-09-21 DIAGNOSIS — M47816 Spondylosis without myelopathy or radiculopathy, lumbar region: Secondary | ICD-10-CM | POA: Diagnosis present

## 2018-09-21 DIAGNOSIS — Z87891 Personal history of nicotine dependence: Secondary | ICD-10-CM

## 2018-09-21 DIAGNOSIS — M5136 Other intervertebral disc degeneration, lumbar region: Secondary | ICD-10-CM | POA: Diagnosis present

## 2018-09-21 DIAGNOSIS — M7138 Other bursal cyst, other site: Secondary | ICD-10-CM | POA: Diagnosis present

## 2018-09-21 DIAGNOSIS — H04123 Dry eye syndrome of bilateral lacrimal glands: Secondary | ICD-10-CM

## 2018-09-21 DIAGNOSIS — K219 Gastro-esophageal reflux disease without esophagitis: Secondary | ICD-10-CM

## 2018-09-21 DIAGNOSIS — M4807 Spinal stenosis, lumbosacral region: Secondary | ICD-10-CM | POA: Diagnosis present

## 2018-09-21 DIAGNOSIS — M5187 Other intervertebral disc disorders, lumbosacral region: Secondary | ICD-10-CM

## 2018-09-21 DIAGNOSIS — E785 Hyperlipidemia, unspecified: Secondary | ICD-10-CM

## 2018-09-21 DIAGNOSIS — M48062 Spinal stenosis, lumbar region with neurogenic claudication: Principal | ICD-10-CM | POA: Diagnosis present

## 2018-09-21 DIAGNOSIS — M48061 Spinal stenosis, lumbar region without neurogenic claudication: Secondary | ICD-10-CM

## 2018-09-21 DIAGNOSIS — M5137 Other intervertebral disc degeneration, lumbosacral region: Secondary | ICD-10-CM | POA: Diagnosis present

## 2018-09-21 HISTORY — PX: LAMINECTOMY, POSTERIOR LUMBAR, DECOMPRESSION, FUSION LEVEL 2: SHX4459

## 2018-09-21 SURGERY — LAMINECTOMY, POSTERIOR LUMBAR, DECOMPRESSION, FUSION LEVEL 2
Anesthesia: Anesthesia General | Wound class: Clean

## 2018-09-21 MED ORDER — CEFAZOLIN SODIUM-DEXTROSE 2-3 GM-%(50ML) IV SOLR
2.0000 g | Freq: Once | INTRAVENOUS | Status: AC
Start: 2018-09-21 — End: 2018-09-21
  Administered 2018-09-21 (×2): 2 g via INTRAVENOUS

## 2018-09-21 MED ORDER — SCOPOLAMINE 1 MG/3DAYS TD PT72
MEDICATED_PATCH | TRANSDERMAL | Status: AC
Start: 2018-09-21 — End: ?
  Filled 2018-09-21: qty 1

## 2018-09-21 MED ORDER — FENTANYL CITRATE (PF) 50 MCG/ML IJ SOLN (WRAP)
INTRAMUSCULAR | Status: AC
Start: 2018-09-21 — End: 2018-09-21
  Administered 2018-09-21: 18:00:00 50 ug via INTRAVENOUS
  Filled 2018-09-21: qty 2

## 2018-09-21 MED ORDER — GELATIN ABSORBABLE 100 EX MISC
CUTANEOUS | Status: DC | PRN
Start: 2018-09-21 — End: 2018-09-21
  Administered 2018-09-21: 1 via TOPICAL

## 2018-09-21 MED ORDER — MAGNESIUM SULFATE IN D5W 1-5 GM/100ML-% IV SOLN
INTRAVENOUS | Status: AC
Start: 2018-09-21 — End: 2018-09-21
  Filled 2018-09-21: qty 200

## 2018-09-21 MED ORDER — SODIUM CHLORIDE BACTERIOSTATIC 0.9 % IJ SOLN
INTRAMUSCULAR | Status: DC | PRN
Start: 2018-09-21 — End: 2018-09-21
  Administered 2018-09-21: 10 mL

## 2018-09-21 MED ORDER — OXYCODONE HCL 5 MG PO TABS
10.0000 mg | ORAL_TABLET | ORAL | Status: DC | PRN
Start: 2018-09-21 — End: 2018-09-24

## 2018-09-21 MED ORDER — ONDANSETRON 4 MG PO TBDP
4.0000 mg | ORAL_TABLET | Freq: Three times a day (TID) | ORAL | Status: DC | PRN
Start: 2018-09-21 — End: 2018-09-24

## 2018-09-21 MED ORDER — SCOPOLAMINE 1 MG/3DAYS TD PT72
MEDICATED_PATCH | TRANSDERMAL | Status: DC | PRN
Start: 2018-09-21 — End: 2018-09-21
  Administered 2018-09-21: 1 via TRANSDERMAL

## 2018-09-21 MED ORDER — OXYCODONE HCL ER 10 MG PO T12A
EXTENDED_RELEASE_TABLET | ORAL | Status: AC
Start: 2018-09-21 — End: ?
  Filled 2018-09-21: qty 1

## 2018-09-21 MED ORDER — HYDROMORPHONE HCL 0.5 MG/0.5 ML IJ SOLN
0.5000 mg | INTRAMUSCULAR | Status: DC | PRN
Start: 2018-09-21 — End: 2018-09-21
  Administered 2018-09-21: 0.5 mg via INTRAVENOUS

## 2018-09-21 MED ORDER — CEFAZOLIN SODIUM 10 G IJ SOLR
2.0000 g | Freq: Once | INTRAMUSCULAR | Status: DC
Start: 2018-09-21 — End: 2018-09-21

## 2018-09-21 MED ORDER — EPINEPHRINE HCL 1 MG/ML IJ SOLN (WRAP)
Status: DC | PRN
Start: 2018-09-21 — End: 2018-09-21
  Administered 2018-09-21: 1 mg

## 2018-09-21 MED ORDER — PROMETHAZINE HCL 25 MG PO TABS
25.0000 mg | ORAL_TABLET | Freq: Four times a day (QID) | ORAL | Status: DC | PRN
Start: 2018-09-21 — End: 2018-09-24

## 2018-09-21 MED ORDER — ONDANSETRON HCL 4 MG/2ML IJ SOLN
4.0000 mg | Freq: Once | INTRAMUSCULAR | Status: AC | PRN
Start: 2018-09-21 — End: 2018-09-21

## 2018-09-21 MED ORDER — PROPOFOL INFUSION 10 MG/ML
INTRAVENOUS | Status: DC | PRN
Start: 2018-09-21 — End: 2018-09-21
  Administered 2018-09-21: 25 ug/kg/min via INTRAVENOUS

## 2018-09-21 MED ORDER — MAGNESIUM HYDROXIDE 400 MG/5ML PO SUSP
10.0000 mL | ORAL | Status: DC | PRN
Start: 2018-09-21 — End: 2018-09-24
  Administered 2018-09-23 (×2): 10 mL via ORAL
  Filled 2018-09-21 (×2): qty 30

## 2018-09-21 MED ORDER — LIDOCAINE HCL 2 % IJ SOLN
INTRAMUSCULAR | Status: DC | PRN
Start: 2018-09-21 — End: 2018-09-21
  Administered 2018-09-21: 50 mg

## 2018-09-21 MED ORDER — FAMOTIDINE 10 MG/ML IV SOLN (WRAP)
INTRAVENOUS | Status: DC | PRN
Start: 2018-09-21 — End: 2018-09-21
  Administered 2018-09-21: 20 mg via INTRAVENOUS

## 2018-09-21 MED ORDER — CELECOXIB 200 MG PO CAPS
ORAL_CAPSULE | ORAL | Status: AC
Start: 2018-09-21 — End: ?
  Filled 2018-09-21: qty 1

## 2018-09-21 MED ORDER — LACTATED RINGERS IV SOLN
INTRAVENOUS | Status: DC
Start: 2018-09-21 — End: 2018-09-24

## 2018-09-21 MED ORDER — HYDROMORPHONE HCL 0.5 MG/0.5 ML IJ SOLN
0.4000 mg | INTRAMUSCULAR | Status: AC | PRN
Start: 2018-09-21 — End: 2018-09-23
  Administered 2018-09-22: 0.4 mg via INTRAVENOUS
  Filled 2018-09-21: qty 1

## 2018-09-21 MED ORDER — BACITRACIN 50000 UNITS IM SOLR
INTRAMUSCULAR | Status: DC | PRN
Start: 2018-09-21 — End: 2018-09-21
  Administered 2018-09-21: 50000 [IU]

## 2018-09-21 MED ORDER — FAMOTIDINE 20 MG/2ML IV SOLN
INTRAVENOUS | Status: AC
Start: 2018-09-21 — End: ?
  Filled 2018-09-21: qty 2

## 2018-09-21 MED ORDER — SODIUM CHLORIDE 0.9 % IV SOLN
INTRAVENOUS | Status: DC
Start: 2018-09-21 — End: 2018-09-24

## 2018-09-21 MED ORDER — LIDOCAINE IN D5W 4-5 MG/ML-% IV SOLN
INTRAVENOUS | Status: DC | PRN
Start: 2018-09-21 — End: 2018-09-21
  Administered 2018-09-21: 1.6 mg/min via INTRAVENOUS

## 2018-09-21 MED ORDER — FENTANYL CITRATE (PF) 50 MCG/ML IJ SOLN (WRAP)
50.0000 ug | INTRAMUSCULAR | Status: DC | PRN
Start: 2018-09-21 — End: 2018-09-21
  Administered 2018-09-21: 50 ug via INTRAVENOUS

## 2018-09-21 MED ORDER — CEFAZOLIN SODIUM-DEXTROSE 2-3 GM-%(50ML) IV SOLR
2.0000 g | Freq: Three times a day (TID) | INTRAVENOUS | Status: DC
Start: 2018-09-21 — End: 2018-09-22
  Administered 2018-09-21 – 2018-09-22 (×2): 2 g via INTRAVENOUS
  Filled 2018-09-21 (×2): qty 50

## 2018-09-21 MED ORDER — KETAMINE HCL 50 MG/ML IJ SOLN
INTRAMUSCULAR | Status: AC
Start: 2018-09-21 — End: ?
  Filled 2018-09-21: qty 10

## 2018-09-21 MED ORDER — CEFAZOLIN SODIUM 1 G IJ SOLR
INTRAMUSCULAR | Status: AC
Start: 2018-09-21 — End: ?
  Filled 2018-09-21: qty 2000

## 2018-09-21 MED ORDER — FENTANYL CITRATE (PF) 50 MCG/ML IJ SOLN (WRAP)
INTRAMUSCULAR | Status: DC | PRN
Start: 2018-09-21 — End: 2018-09-21
  Administered 2018-09-21 (×3): 50 ug via INTRAVENOUS
  Administered 2018-09-21: 100 ug via INTRAVENOUS

## 2018-09-21 MED ORDER — PROPOFOL 10 MG/ML IV EMUL (WRAP)
INTRAVENOUS | Status: AC
Start: 2018-09-21 — End: ?
  Filled 2018-09-21: qty 100

## 2018-09-21 MED ORDER — CYCLOBENZAPRINE HCL 10 MG PO TABS
5.0000 mg | ORAL_TABLET | Freq: Three times a day (TID) | ORAL | Status: DC | PRN
Start: 2018-09-21 — End: 2018-09-24

## 2018-09-21 MED ORDER — METOCLOPRAMIDE HCL 5 MG/ML IJ SOLN
INTRAMUSCULAR | Status: AC
Start: 2018-09-21 — End: ?
  Filled 2018-09-21: qty 2

## 2018-09-21 MED ORDER — LABETALOL HCL 5 MG/ML IV SOLN (WRAP)
INTRAVENOUS | Status: DC | PRN
Start: 2018-09-21 — End: 2018-09-21
  Administered 2018-09-21: 5 mg via INTRAVENOUS

## 2018-09-21 MED ORDER — ONDANSETRON HCL 4 MG/2ML IJ SOLN
4.0000 mg | Freq: Three times a day (TID) | INTRAMUSCULAR | Status: DC | PRN
Start: 2018-09-21 — End: 2018-09-24
  Filled 2018-09-21: qty 2

## 2018-09-21 MED ORDER — LABETALOL HCL 5 MG/ML IV SOLN (WRAP)
INTRAVENOUS | Status: AC
Start: 2018-09-21 — End: ?
  Filled 2018-09-21: qty 4

## 2018-09-21 MED ORDER — ONDANSETRON HCL 4 MG/2ML IJ SOLN
INTRAMUSCULAR | Status: DC | PRN
Start: 2018-09-21 — End: 2018-09-21
  Administered 2018-09-21: 4 mg via INTRAVENOUS

## 2018-09-21 MED ORDER — LUBRIFRESH P.M. OP OINT
TOPICAL_OINTMENT | OPHTHALMIC | Status: AC
Start: 2018-09-21 — End: ?
  Filled 2018-09-21: qty 3.5

## 2018-09-21 MED ORDER — BENZOCAINE-MENTHOL 15-3.6 MG MT LOZG
1.0000 | LOZENGE | OROMUCOSAL | Status: DC | PRN
Start: 2018-09-21 — End: 2018-09-24

## 2018-09-21 MED ORDER — OXYCODONE HCL 5 MG PO TABS
15.0000 mg | ORAL_TABLET | ORAL | Status: DC | PRN
Start: 2018-09-21 — End: 2018-09-24

## 2018-09-21 MED ORDER — MIDAZOLAM HCL 1 MG/ML IJ SOLN (WRAP)
INTRAMUSCULAR | Status: DC | PRN
Start: 2018-09-21 — End: 2018-09-21
  Administered 2018-09-21: 2 mg via INTRAVENOUS

## 2018-09-21 MED ORDER — SODIUM CHLORIDE 0.9 % IR SOLN
Status: DC | PRN
Start: 2018-09-21 — End: 2018-09-21
  Administered 2018-09-21: 1000 mL

## 2018-09-21 MED ORDER — PROPOFOL 10 MG/ML IV EMUL (WRAP)
INTRAVENOUS | Status: DC | PRN
Start: 2018-09-21 — End: 2018-09-21
  Administered 2018-09-21: 150 mg via INTRAVENOUS
  Administered 2018-09-21: 50 mg via INTRAVENOUS

## 2018-09-21 MED ORDER — PROPOFOL 10 MG/ML IV EMUL (WRAP)
INTRAVENOUS | Status: AC
Start: 2018-09-21 — End: ?
  Filled 2018-09-21: qty 20

## 2018-09-21 MED ORDER — PROMETHAZINE HCL 25 MG/ML IJ SOLN
6.5000 mg | Freq: Four times a day (QID) | INTRAMUSCULAR | Status: DC | PRN
Start: 2018-09-21 — End: 2018-09-24
  Administered 2018-09-21 – 2018-09-22 (×2): 6.5 mg via INTRAVENOUS
  Filled 2018-09-21 (×2): qty 1

## 2018-09-21 MED ORDER — BISACODYL 10 MG RE SUPP
10.0000 mg | Freq: Every day | RECTAL | Status: DC | PRN
Start: 2018-09-21 — End: 2018-09-24

## 2018-09-21 MED ORDER — KETAMINE HCL 10 MG/ML IJ/IV SOLN (WRAP)
3.0000 ug/kg/min | Status: DC
Start: 2018-09-21 — End: 2018-09-23
  Administered 2018-09-21 – 2018-09-22 (×3): 3 ug/kg/min via INTRAVENOUS
  Filled 2018-09-21 (×3): qty 20

## 2018-09-21 MED ORDER — ACETAMINOPHEN 500 MG PO TABS
ORAL_TABLET | ORAL | Status: AC
Start: 2018-09-21 — End: ?
  Filled 2018-09-21: qty 1

## 2018-09-21 MED ORDER — ONDANSETRON HCL 4 MG/2ML IJ SOLN
INTRAMUSCULAR | Status: AC
Start: 2018-09-21 — End: 2018-09-21
  Administered 2018-09-21: 18:00:00 4 mg via INTRAVENOUS
  Filled 2018-09-21: qty 2

## 2018-09-21 MED ORDER — PREGABALIN 75 MG PO CAPS
ORAL_CAPSULE | ORAL | Status: AC
Start: 2018-09-21 — End: ?
  Filled 2018-09-21: qty 1

## 2018-09-21 MED ORDER — CELECOXIB 200 MG PO CAPS
200.0000 mg | ORAL_CAPSULE | Freq: Once | ORAL | Status: AC
Start: 2018-09-21 — End: 2018-09-21
  Administered 2018-09-21: 12:00:00 200 mg via ORAL

## 2018-09-21 MED ORDER — OXYCODONE HCL ER 10 MG PO T12A
10.0000 mg | EXTENDED_RELEASE_TABLET | Freq: Once | ORAL | Status: AC
Start: 2018-09-21 — End: 2018-09-21
  Administered 2018-09-21: 12:00:00 10 mg via ORAL

## 2018-09-21 MED ORDER — MICROFIBRILLAR COLL HEMOSTAT EX POWD
CUTANEOUS | Status: DC | PRN
Start: 2018-09-21 — End: 2018-09-21
  Administered 2018-09-21: 1 g via TOPICAL

## 2018-09-21 MED ORDER — ROCURONIUM BROMIDE 50 MG/5ML IV SOLN
INTRAVENOUS | Status: AC
Start: 2018-09-21 — End: ?
  Filled 2018-09-21: qty 5

## 2018-09-21 MED ORDER — METOCLOPRAMIDE HCL 5 MG/ML IJ SOLN
INTRAMUSCULAR | Status: DC | PRN
Start: 2018-09-21 — End: 2018-09-21
  Administered 2018-09-21: 20 mg via INTRAVENOUS

## 2018-09-21 MED ORDER — DEXAMETHASONE SODIUM PHOSPHATE 4 MG/ML IJ SOLN (WRAP)
INTRAMUSCULAR | Status: DC | PRN
Start: 2018-09-21 — End: 2018-09-21
  Administered 2018-09-21: 10 mg via INTRAVENOUS

## 2018-09-21 MED ORDER — NEOSTIGMINE METHYLSULFATE 1 MG/ML IJ/IV SOLN (WRAP)
Status: DC | PRN
Start: 2018-09-21 — End: 2018-09-21
  Administered 2018-09-21: 3 mg via INTRAVENOUS

## 2018-09-21 MED ORDER — THROMBIN 5000 UNITS EX SOLR
CUTANEOUS | Status: DC | PRN
Start: 2018-09-21 — End: 2018-09-21
  Administered 2018-09-21: 15000 [IU] via TOPICAL

## 2018-09-21 MED ORDER — MAGNESIUM SULFATE IN D5W 1-5 GM/100ML-% IV SOLN
INTRAVENOUS | Status: AC
Start: 2018-09-21 — End: 2018-09-22
  Filled 2018-09-21: qty 100

## 2018-09-21 MED ORDER — FLEET ENEMA 7-19 GM/118ML RE ENEM
1.0000 | ENEMA | Freq: Once | RECTAL | Status: DC | PRN
Start: 2018-09-21 — End: 2018-09-24

## 2018-09-21 MED ORDER — ONDANSETRON HCL 4 MG/2ML IJ SOLN
INTRAMUSCULAR | Status: AC
Start: 2018-09-21 — End: ?
  Filled 2018-09-21: qty 2

## 2018-09-21 MED ORDER — MIDAZOLAM HCL 1 MG/ML IJ SOLN (WRAP)
INTRAMUSCULAR | Status: AC
Start: 2018-09-21 — End: ?
  Filled 2018-09-21: qty 2

## 2018-09-21 MED ORDER — PREGABALIN 75 MG PO CAPS
75.0000 mg | ORAL_CAPSULE | Freq: Once | ORAL | Status: AC
Start: 2018-09-21 — End: 2018-09-21
  Administered 2018-09-21: 12:00:00 75 mg via ORAL

## 2018-09-21 MED ORDER — HYDROMORPHONE HCL 1 MG/ML IJ SOLN
INTRAMUSCULAR | Status: AC
Start: 2018-09-21 — End: 2018-09-21
  Administered 2018-09-21: 19:00:00 0.5 mg via INTRAVENOUS
  Filled 2018-09-21: qty 1

## 2018-09-21 MED ORDER — CEFAZOLIN SODIUM-DEXTROSE 2-3 GM-%(50ML) IV SOLR
INTRAVENOUS | Status: AC
Start: 2018-09-21 — End: ?
  Filled 2018-09-21: qty 50

## 2018-09-21 MED ORDER — GLYCOPYRROLATE 0.2 MG/ML IJ SOLN
INTRAMUSCULAR | Status: DC | PRN
Start: 2018-09-21 — End: 2018-09-21
  Administered 2018-09-21: 0.4 mg via INTRAVENOUS

## 2018-09-21 MED ORDER — LIDOCAINE IN D5W 4-5 MG/ML-% IV SOLN
INTRAVENOUS | Status: AC
Start: 2018-09-21 — End: 2018-09-21
  Filled 2018-09-21: qty 500

## 2018-09-21 MED ORDER — KETAMINE HCL 50 MG/ML IJ SOLN
INTRAMUSCULAR | Status: DC | PRN
Start: 2018-09-21 — End: 2018-09-21
  Administered 2018-09-21: 70 mg via INTRAVENOUS

## 2018-09-21 MED ORDER — PROMETHAZINE HCL 25 MG RE SUPP
25.0000 mg | Freq: Four times a day (QID) | RECTAL | Status: DC | PRN
Start: 2018-09-21 — End: 2018-09-24

## 2018-09-21 MED ORDER — GLYCOPYRROLATE 0.2 MG/ML IJ SOLN
INTRAMUSCULAR | Status: AC
Start: 2018-09-21 — End: ?
  Filled 2018-09-21: qty 1

## 2018-09-21 MED ORDER — ACETAMINOPHEN 500 MG PO TABS
1000.0000 mg | ORAL_TABLET | Freq: Once | ORAL | Status: AC
Start: 2018-09-21 — End: 2018-09-21
  Administered 2018-09-21: 12:00:00 500 mg via ORAL

## 2018-09-21 MED ORDER — SENNOSIDES-DOCUSATE SODIUM 8.6-50 MG PO TABS
1.0000 | ORAL_TABLET | Freq: Two times a day (BID) | ORAL | Status: DC
Start: 2018-09-21 — End: 2018-09-24
  Administered 2018-09-22 – 2018-09-24 (×5): 1 via ORAL
  Filled 2018-09-21 (×7): qty 1

## 2018-09-21 MED ORDER — HYDROMORPHONE HCL 1 MG/ML IJ SOLN
INTRAMUSCULAR | Status: AC
Start: 2018-09-21 — End: ?
  Filled 2018-09-21: qty 1

## 2018-09-21 MED ORDER — ACETAMINOPHEN 500 MG PO TABS
1000.0000 mg | ORAL_TABLET | Freq: Three times a day (TID) | ORAL | Status: DC
Start: 2018-09-21 — End: 2018-09-24
  Administered 2018-09-22 – 2018-09-24 (×7): 1000 mg via ORAL
  Filled 2018-09-21 (×7): qty 2

## 2018-09-21 MED ORDER — AMMONIA AROMATIC IN INHA
1.0000 | Freq: Once | RESPIRATORY_TRACT | Status: DC | PRN
Start: 2018-09-21 — End: 2018-09-21

## 2018-09-21 MED ORDER — DIPHENHYDRAMINE HCL 25 MG PO CAPS
25.0000 mg | ORAL_CAPSULE | Freq: Four times a day (QID) | ORAL | Status: DC | PRN
Start: 2018-09-21 — End: 2018-09-24

## 2018-09-21 MED ORDER — HYDROMORPHONE HCL 1 MG/ML IJ SOLN
INTRAMUSCULAR | Status: DC | PRN
Start: 2018-09-21 — End: 2018-09-21
  Administered 2018-09-21 (×2): 0.5 mg via INTRAVENOUS

## 2018-09-21 MED ORDER — ROCURONIUM BROMIDE 10 MG/ML IV SOLN (WRAP)
INTRAVENOUS | Status: DC | PRN
Start: 2018-09-21 — End: 2018-09-21
  Administered 2018-09-21: 30 mg via INTRAVENOUS

## 2018-09-21 MED ORDER — OXYCODONE HCL 5 MG PO TABS
5.0000 mg | ORAL_TABLET | ORAL | Status: DC | PRN
Start: 2018-09-21 — End: 2018-09-24
  Administered 2018-09-23: 0.5 mg via ORAL
  Administered 2018-09-23 – 2018-09-24 (×4): 2.5 mg via ORAL
  Filled 2018-09-21 (×5): qty 1

## 2018-09-21 SURGICAL SUPPLY — 140 items
ANGIO CATH CODM (Procedure Accessories) ×2 IMPLANT
BANDAGE ADH PLSTR POLYACRYLATE CVRL 2YD (Dressing) ×1
BANDAGE ADHESIVE L2 YD X W6 IN STRETCH (Dressing) ×1 IMPLANT
BANDAGE ADHESIVE L2 YD X W6 IN STRETCH NONWOVEN POROUS COVER-ROLL (Dressing) ×1 IMPLANT
BNDG CVRL ADH 2YDX6IN PLSTR POLYACRYLATE (Dressing) ×1
BULB DRAINAGE LIGHTWEIGHT LOW LEVEL (Drain) ×1 IMPLANT
BULB DRAINAGE LIGHTWEIGHT LOW LEVEL SUCTION RELIAVAC SILICONE 100 CC (Drain) ×1 IMPLANT
BULB DRN SIL 100CC LF STRL LTWT LO LVL (Drain) ×2
CATHETER IV JELCO 14GA 2IN STRL RADOPQ (IV Supply) ×2
CATHETER IV OD14 GA L2 IN RADIOPAQUE (IV Supply) ×1 IMPLANT
CATHETER IV OD14 GA L2 IN RADIOPAQUE JELCO (IV Supply) ×1 IMPLANT
CLEANER ELECTROSURGICAL TIP PENCIL (Procedure Accessories) ×1 IMPLANT
CLEANER ELECTROSURGICAL TIP PENCIL HANDSWITCH LECTROBRASIVE (Procedure Accessories) ×1 IMPLANT
CLEANER ESURG TIP PNCL LCTRBRS STRL (Procedure Accessories) ×2
COVER FLEXIBLE LIGHT HANDLE PLASTIC GREEN (Procedure Accessories) ×2 IMPLANT
COVER FLEXIBLE MEDLINE LIGHT HANDLE (Procedure Accessories) ×2 IMPLANT
COVER LGHT HNDL PLS LF STRL FLXB DISP (Procedure Accessories) ×4
COVER MAYO CNVRT STND 23IN PLS REINF (Drape) ×1
COVER STAND W23 IN REINFORCE PLASTIC (Drape) ×1 IMPLANT
COVER STND PLS MAYO CNVRT 23IN LF STRL (Drape) ×1 IMPLANT
COVER TABLE L90 IN X W65 IN REINFORCE (Drape) ×1 IMPLANT
COVER TABLE L90 IN X W65 IN REINFORCE HEAVY DUTY CONVERTORS POLY (Drape) ×1 IMPLANT
COVER TBL POLY CNVRT 90X65IN STRL REINF (Drape) ×2
DECANTER 9 BAG (Procedure Accessories) ×1
DECANTER FLD 9IN LTX BG WHT (Procedure Accessories) ×1
DECANTER FLUID L9 IN BAG MEDLINE WHITE (Procedure Accessories) ×1 IMPLANT
DECANTER FLUID L9 IN BAG WHITE (Procedure Accessories) ×1 IMPLANT
DRAIN INCS SIL FULL FLUT FLT 3/8IN LF (Drain) ×2
DRAIN RADIOPAQUE 4 FREE FLOW CHANNEL (Drain) ×1 IMPLANT
DRAIN RADIOPAQUE 4 FREE FLOW CHANNEL BARD L3/8 IN INCISION SILICONE (Drain) ×1 IMPLANT
DRAPE MAG FM DVN 20X16IN LF STRL HNDFR (Drape) ×2
DRAPE MAGNETIC HAND FREE TRANSFER (Drape) ×1 IMPLANT
DRAPE SRG TBRN CNVRT 122X106X77IN LF (Drape) ×2 IMPLANT
DRAPE SRG TBRN LG CNVRT 98X72IN LF STRL (Drape) ×2
DRAPE SURGICAL FANFOLD L98 IN X W72 IN (Drape) ×1 IMPLANT
DRAPE SURGICAL FANFOLD L98 IN X W72 IN CONVERTORS TIBURON LARGE (Drape) ×1 IMPLANT
DRAPE SURGICAL IMPERVIOUS REINFORCEMENT FENESTRATE ABSORBENT ARMBOARD (Drape) ×1 IMPLANT
DRESSING WND ACRL COVAD 4X4IN LF STRL HI (Dressing) ×2 IMPLANT
ELECTRODE ADULT PATIENT RETURN L9 FT REM POLYHESIVE ACRYLIC FOAM (Procedure Accessories) ×1 IMPLANT
ELECTRODE PATIENT RETURN L9 FT VALLEYLAB (Procedure Accessories) ×1 IMPLANT
ELECTRODE PT RTN RM PHSV ACRL FM C30- LB (Procedure Accessories) ×2
EXTENDER BNGF HYALURONIC ACD POLY (Graft) ×2 IMPLANT
EXTENDER BONE GRAFT 10 CC PASTE MIX PLUS INQU HYALURONIC ACID POLY (Graft) ×1 IMPLANT
FORCEPS ELECTROSURGICAL L7 3/4 IN 1.5 MM BAYONET BIPOLAR INSULATE CORD (Disposable Instruments) ×1 IMPLANT
FORCEPS ESURG SS 1.5MM BYNT LBRTY S-G (Disposable Instruments) ×2 IMPLANT
GAUZE COVER ROLL STRETCH 6 (Dressing) ×2 IMPLANT
GLOVE SRG NTR RBR 8 INDCTR BGL 299X103MM (Glove) ×4
GLOVE SURGICAL 8 INDICATOR BIOGEL POWDER (Glove) ×2 IMPLANT
GLOVE SURGICAL 8 INDICATOR BIOGEL POWDER FREE SMOOTH BEAD CUFF (Glove) ×2 IMPLANT
GOWN SRG FBRC LG STD ROYALSILK LF STRL (Gown) ×2
GOWN SURGICAL LARGE STANDARD FABRIC (Gown) ×1 IMPLANT
GOWN SURGICAL LARGE STANDARD FABRIC ROYALSILK LEVEL 3 NONREINFORCE SET (Gown) ×1 IMPLANT
GRAFT BN CLGN HA TCP 15CC THK6MM VENADO (Graft) ×2 IMPLANT
GRAFT BONE DEMINERALIZED CORTICAL BONE 10 CC ALLOGRAFT FIBER (Graft) ×1 IMPLANT
GRAFT BONE THK6MM 100X25MM COLLAGEN HA TCP 15CC VENADO STRIP (Graft) ×1 IMPLANT
GRAFT INFLUX DBM FIBERS 10CC (Graft) ×2 IMPLANT
GRAFT INFLUX DBM FIBERS 20CC (Graft) ×4 IMPLANT
GRAFT INFLUX DBM FIBERS 20CC IFLXCF20 (Graft) ×2 IMPLANT
KIT PREP LABORATORY ERYTHROCYTE AND MARROW MAGELLAN MAR01 (Kits) ×1 IMPLANT
KIT PREP MARROW (Kits) ×2 IMPLANT
NEEDLE FLTR PLYCRB 5UM RW BD 19GA 1.5IN (Needles) ×2 IMPLANT
NEEDLE SPINAL BD OD22 GA L3 1/2 IN (Needles) IMPLANT
NEEDLE SPINAL L3 1/2 IN REGULAR WALL QUINCKE TIP OD22 GA BD (Needles) IMPLANT
NEEDLE SPNL PP RW BD QNCK 22GA 3.5IN LF (Needles)
PACK SPNE FFX (Pack) ×2 IMPLANT
PAD ABD DERMACEA 9X5IN LF STRL 4 SL EDG (Dressing) ×2
PAD ABD PVC CRTY 9X5IN LF STRL 3 LYR (Dressing) ×2 IMPLANT
PAD ABDOMINAL L9 IN X W5 IN 4 SEAL EDGE (Dressing) ×1 IMPLANT
PAD ARMBOARD FOAM 20X8X2IN (Positioning Supplies) ×6
PAD ARMBOARD L20 IN X W8 IN X H2 IN (Positioning Supplies) ×3 IMPLANT
PAD ARMBOARD L20 IN X W8 IN X H2 IN CONVOLUTE FOAM PURPLE (Positioning Supplies) ×3 IMPLANT
POSITIONER HEAD FOAM SLOTTED (Positioning Supplies) ×2
POSITIONER OR L8.5 IN X W8 IN X H4 IN (Positioning Supplies) ×1 IMPLANT
POSITIONER OR L8.5 IN X W8 IN X H4 IN HIGH RESILIENT SLOT FOAM HEAD (Positioning Supplies) ×1 IMPLANT
ROD SPINAL DENALI L55 MM OD5.5 MM (Rods) ×1 IMPLANT
ROD SPINAL DENALI L55 MM OD5.5 MM TITANIUM CONTOUR GRAY (Rods) ×1 IMPLANT
ROD SPINAL DENALI L60 MM OD5.5 MM (Rods) ×1 IMPLANT
ROD SPINAL DENALI L60 MM OD5.5 MM TITANIUM CONTOUR GRAY (Rods) ×1 IMPLANT
ROD SPNL TI CNTR DENALI 5.5MM 55MM NS (Rods) ×2 IMPLANT
ROD SPNL TI CNTR DENALI 5.5MM 60MM NS (Rods) ×2 IMPLANT
SCREW BN VRST 6.5MM 45MM NS PA SPNE (Screw) ×4 IMPLANT
SCREW BN VRST 6.5MM 50MM NS PA SPNE (Screw) ×4 IMPLANT
SCREW BN VRST 7.5MM 40MM NS PA SPNE (Screw) ×4 IMPLANT
SCREW L40 MM OD7.5 MM SPINE POLYAXIAL (Screw) ×2 IMPLANT
SCREW L40 MM OD7.5 MM SPINE POLYAXIAL BONE EVEREST (Screw) ×2 IMPLANT
SCREW L45 MM OD6.5 MM SPINE POLYAXIAL (Screw) ×2 IMPLANT
SCREW L45 MM OD6.5 MM SPINE POLYAXIAL BONE EVEREST (Screw) ×2 IMPLANT
SCREW L50 MM OD6.5 MM SPINE POLYAXIAL (Screw) ×2 IMPLANT
SCREW L50 MM OD6.5 MM SPINE POLYAXIAL BONE EVEREST (Screw) ×2 IMPLANT
SCREW SET VRST NS SPNE (Screw) ×12 IMPLANT
SET SCREW LUMBAR ONE LEVEL EVEREST (Screw) ×6 IMPLANT
SLEEVE TED SCD LARGE (Procedure Accessories) ×2 IMPLANT
SMOKE EVACUATION ATTACHMENT (Tubing) ×2 IMPLANT
SOLUTION SRGPRP 74% ISPRP 0.7% IOD (Prep) ×2
SOLUTION SURGICAL PREP 26 ML DURAPREP (Prep) ×1 IMPLANT
SOLUTION SURGICAL PREP 26 ML DURAPREP 74% ISOPROPYL ALCOHOL 0.7% (Prep) ×1 IMPLANT
SPONGE GAUZE 12PLY STRL 4X4IN (Sponge) ×2 IMPLANT
SPONGE LAP CTTN 18X18IN LF STRL 4 PLY (Sponge)
SPONGE LAPAROTOMY L18 IN X W18 IN 4 PLY (Sponge) IMPLANT
SPONGE LAPAROTOMY L18 IN X W18 IN 4 PLY RADIOPAQUE PYRONEMA FREE HIGH (Sponge) IMPLANT
SPONGE PEANUT C5 HOLDER DISSECTOR XRAY (Sponge) IMPLANT
SPONGE PEANUT C5 HOLDER DISSECTOR XRAY DETECTABLE OD3/8 IN DUKAL (Sponge) IMPLANT
SPONGE PEANUT RADPQE STRL3/8IN (Sponge)
SPONGE SRG CTTND CDMN SUTUREWELD .5X.5IN (Dressing)
SPONGE SRG CTTND CDMN SUTUREWELD 1X.5IN (Sponge) IMPLANT
SPONGE SRG CTTND CDMN SUTUREWELD 3X1IN (Sponge) ×2
SPONGE SRG VISTEC 8X4IN LF STRL 12 PLY (Sponge)
SPONGE SRGCL 1/2X1/2IN ABSRBNT PRECISION CUT RDPQ PTT CTTND CDMN (Dressing) IMPLANT
SPONGE SURGICAL L1 IN X W1/2 IN ABSORBENT RADIOPAQUE PRECISION CUT (Sponge) IMPLANT
SPONGE SURGICAL L1/2 IN X W1/2 IN (Dressing) IMPLANT
SPONGE SURGICAL L3 IN X W1 IN ABSORBENT (Sponge) ×1 IMPLANT
SPONGE SURGICAL L3 IN X W1 IN ABSORBENT PRECISION CUT RADIOPAQUE (Sponge) ×1 IMPLANT
SPONGE SURGICAL L8 IN X W4 IN 12 PLY (Sponge) IMPLANT
SPONGE SURGICAL L8 IN X W4 IN 12 PLY RADIOPAQUE BAND VISTEC BLUE WHITE (Sponge) IMPLANT
STNT TAXUS LIBERTE RX 2.75X16 (Stent) ×2 IMPLANT
STRIP SKIN CLOSURE L5 IN X W1 IN (Dressing) ×1 IMPLANT
STRIP SKIN CLOSURE L5 IN X W1 IN REINFORCE STERI-STRIP POLYESTER WHITE (Dressing) ×1 IMPLANT
STRIP SKNCLS PLSTR STRSTRP 5X1IN LF STRL (Dressing) ×2
SUTURE ABS 1 CT1 VCL 18IN CR BRD 8 STRN (Suture) ×4
SUTURE ABS 2-0 CT1 VCL 18IN CR BRD 8 (Suture) ×4
SUTURE ABS 3-0 PS1 VCL MTPS 27IN BRD (Suture) ×2
SUTURE COATED VICRYL 1 CT-1 L18 IN (Suture) ×2 IMPLANT
SUTURE COATED VICRYL 2-0 CT-1 18IN CNTRL BRD 8 STRND UNDYED (Suture) ×2 IMPLANT
SUTURE COATED VICRYL 2-0 CT-1 L18 IN (Suture) ×2 IMPLANT
SUTURE COATED VICRYL 3-0 PS-1 L27 IN (Suture) ×1 IMPLANT
SYRINGE LUER LOCK 10CC (Syringes, Needles) ×8 IMPLANT
SYSTEM CORONARY STENT TAXUS LIBERTE MONORAIL (Stent) ×1 IMPLANT
SYSTEM IRR MTL VTVU YNKR 12FR LF STRL (Suction) ×2 IMPLANT
SYSTEM IRRIGATION EXTEND SUCTION ILLUMINATION TUBE BULB TIP VITAL-VUE (Suction) ×1 IMPLANT
TBNG CON SCTN STRL .1875X10 (Tubing) ×10
TOOL DISSECTING L14 CM MATCH HEAD FLUTE (Burr) ×1 IMPLANT
TOOL DISSECTING L14 CM MATCH HEAD FLUTE OD3 MM MIDAS REX LEGEND (Burr) ×1 IMPLANT
TOOL DSCT LGND 3MM 14MM (Burr) ×2
TOWEL L26 IN X W17 IN COTTON PREWASH DELINT BLUE ACTISORB DELUXE (Procedure Accessories) IMPLANT
TOWEL L27 IN X W17 IN COTTON PREWASH (Other) ×1 IMPLANT
TOWEL L27 IN X W17 IN COTTON PREWASH DELINT HIGH ABSORBENT BLUE (Other) ×1 IMPLANT
TOWEL SRG CTTN 26X17IN LF STRL PREWASH (Procedure Accessories) IMPLANT
TOWEL SRG CTTN 27X17IN LF STRL PREWASH (Other) ×2
TUBING SUCTION ID3/16 IN L10 FT (Tubing) ×5 IMPLANT
TUBING SUCTION ID3/16 IN L10 FT NONCONDUCTIVE FML CONNECTOR MEDLN (Tubing) ×5 IMPLANT

## 2018-09-21 NOTE — Anesthesia Postprocedure Evaluation (Signed)
Anesthesia Post Evaluation    Patient: Carolyn [name]    Procedure(s) with comments:  LAMINECTOMY, POSTERIOR LUMBAR, DECOMPRESSION, FUSION LEVEL 2 - L4-S1 LAMINECTOMY AND FUSION    Anesthesia type: general    Last Vitals:   Vitals Value Taken Time   BP 150/69 09/21/2018  5:40 PM   Temp 36.9 C (98.5 F) 09/21/2018  4:55 PM   Pulse 48 09/21/2018  5:40 PM   Resp 12 09/21/2018  5:40 PM   SpO2 99 % 09/21/2018  5:40 PM       Anesthesia Post Evaluation:     Patient Evaluated: PACU  Patient Participation: complete - patient participated  Level of Consciousness: awake and alert  Pain Score: 4  Pain Management: adequate    Airway Patency: patent    Anesthetic complications: No      PONV Status: none    Cardiovascular status: acceptable  Respiratory status: acceptable  Hydration status: acceptable        Anesthesia Qualified Clinical Data Registry 2018    PACU Reintubation  Did the Patient have general anesthesia with intubation: Yes  Did the Patient require reintubation in the PACU?: No  Was this a planned exubation trial (documented in the medical record)?: No    PONV Adult  Is the patient aged 76 or older: Yes  Did the patient receive recieve a general anesthestic: Yes  Does the patient have 3 or more risk factors for PONV? No        PONV Pediatric  Is the patient aged 70-17? No            PACU Transfer Checklist Protocol  Was the patient transferred to the PACU at the conclusion of surgery? Yes  Was a checklist or transfer protocol used? Yes    ICU Transfer Checklist Protocol  Was the patient transferred to the ICU at the conclusion of surgery? No      Post-op Pain Assessment Prior to Anesthesia Care End  Age >=18 and assessed for pain in PACU: Yes  Pacu pain score <7/10: Yes      Perioperative Mortality  Perioperative mortality prior to Anesthesia end time: No    Perioperative Cardiac Arrest  Did the patient have an unanticipated intraoperative cardiac arrest between anesthesia start time and anesthesia end time?  No    Unplanned Admission to ICU  Did the patient have an unplanned admission to the ICU (not initially anticipated at anesthesia start time)? No      Signed by: Joselyn Arrow, 09/21/2018 5:45 PM

## 2018-09-21 NOTE — H&P (Signed)
History and physical reviewed and patient re-examined today.  No changes noted.

## 2018-09-21 NOTE — Anesthesia Preprocedure Evaluation (Addendum)
Anesthesia Evaluation    AIRWAY           CARDIOVASCULAR           DENTAL         PULMONARY         OTHER FINDINGS    Dry eyes              Relevant Problems   No relevant active problems               Anesthesia Plan    ASA 2     general                     intravenous induction   Detailed anesthesia plan: general endotracheal        Post op pain management: per surgeon    informed consent obtained    Plan discussed with CRNA.                   Signed by: Caroleen Hamman 09/21/18 8:47 AM    ===============================================================  Inpatient Anesthesia Evaluation    Patient Name: Carolyn [name]  Surgeon: Clelia Schaumann, MD  Patient Age / Sex: 77 y.o. / female    Medical History:     Past Medical History:   Diagnosis Date   . Abnormal Pap smear     >20 years ago, colpo, NL since   . Dry eyes     drops daily   . Hyperlipidemia     controlled w/ medication   . Low back pain    . Osteoarthritis     back, knees, hands   . Pelvic mass in female 07/05/2014    Pelvic MRI 2015 Stable exam. 2. Simple cyst in the left mesorectal fat. 3. Proteinaceous cyst in the lower vagina representing a cyst of either Skenes duct or Bartholins gland duct. 4. Submucosal fibroid.        Past Surgical History:   Procedure Laterality Date   . BREAST SURGERY  01/04/09    Left Stereotactic Biopsy    . BUNIONECTOMY Left 06/2016   . COLONOSCOPY  2014   . COLPOSCOPY      >20 years ago    . DILATION AND CURETTAGE OF UTERUS  10/2013   . JOINT REPLACEMENT Right 06/2017    knee         Allergies:   No Known Allergies      Medications:     No current facility-administered medications for this encounter.               Prior to Admission medications    Medication Sig Start Date End Date Taking? Authorizing Provider   acetaminophen (TYLENOL) 500 MG tablet Take 2 tablets by mouth as needed   Yes [provider]   cycloSPORINE (RESTASIS) 0.05 % ophthalmic emulsion Place 1 drop into both eyes 2 (two) times daily   Yes [provider]   Estradiol-Norethindrone Acet 0.5-0.1 MG per tablet Take by mouth daily   Yes [provider]   famotidine (PEPCID) 20 MG tablet Take 20 mg by mouth 2 (two) times daily   Yes [provider]   simvastatin (ZOCOR) 20 MG tablet Take 20 mg by mouth daily    06/21/14  Yes [provider]   BIOTIN PO Take 1,000 mg by mouth daily       [provider]     Vitals  Wt Readings from Last 3 Encounters:   05/30/15 65.5 kg (144 lb 6.4 oz)   07/05/14 65.3 kg (144 lb)   05/24/14 65.8 kg (145 lb)     BMI (Estimated body mass index is 25.35 kg/m as calculated from the following:    Height as of this encounter: 1.626 m (5\' 4" ).    Weight as of this encounter: 67 kg (147 lb 11.3 oz).)  Temp Readings from Last 3 Encounters:   05/30/15 36.6 C (97.9 F) (Tympanic)   05/24/14 37.1 C (98.8 F) (Tympanic)   05/23/13 36.9 C (98.5 F) (Tympanic)     BP Readings from Last 3 Encounters:   05/30/15 128/60   07/05/14 134/82   05/24/14 123/60     Pulse Readings from Last 3 Encounters:   05/30/15 77   05/24/14 68   05/23/13 81           Labs:   CBC:  Lab Results   Component Value Date    WBC 8.47 12/26/2008    HGB 12.4 12/26/2008    HCT 37.0 12/26/2008    PLT 231 12/26/2008       Chemistries:  Lab Results   Component Value Date    NA 136 12/26/2008    K 4.7 12/26/2008    CL 102 12/26/2008    CO2 29 12/26/2008    BUN 19 12/26/2008    CREAT 1.0 12/26/2008    GLU 80 12/26/2008    CA 9.5 12/26/2008       Coags:  No results found for: PT, PTT, INR  _____________________      Signed by: Caroleen Hamman  09/21/18   8:47 AM    =============================================================

## 2018-09-21 NOTE — Transfer of Care (Signed)
Anesthesia Transfer of Care Note    Patient: Carolyn [name]    Procedures performed: Procedure(s) with comments:  LAMINECTOMY, POSTERIOR LUMBAR, DECOMPRESSION, FUSION LEVEL 2 - L4-S1 LAMINECTOMY AND FUSION    Anesthesia type: General ETT    Patient location:Phase I PACU    Last vitals:   Vitals:    09/21/18 1114   BP: 116/59   Pulse: 81   Resp:    Temp: 36.2 C (97.2 F)   SpO2: 100%       Post pain: Patient not complaining of pain, continue current therapy      Mental Status:awake and alert     Respiratory Function: tolerating face mask    Cardiovascular: stable    Nausea/Vomiting: patient not complaining of nausea or vomiting    Hydration Status: adequate    Post assessment: no apparent anesthetic complications and no reportable events    Signed by: Joselyn Arrow  09/21/18 4:57 PM

## 2018-09-22 LAB — BASIC METABOLIC PANEL
BUN: 11 mg/dL (ref 7.0–19.0)
CO2: 23 mEq/L (ref 21–29)
Calcium: 7.9 mg/dL (ref 7.9–10.2)
Chloride: 106 mEq/L (ref 100–111)
Creatinine: 0.9 mg/dL (ref 0.4–1.5)
Glucose: 143 mg/dL — ABNORMAL HIGH (ref 70–100)
Potassium: 4.8 mEq/L (ref 3.5–5.1)
Sodium: 137 mEq/L (ref 136–145)

## 2018-09-22 LAB — CBC
Absolute NRBC: 0 10*3/uL (ref 0.00–0.00)
Hematocrit: 29.1 % — ABNORMAL LOW (ref 34.7–43.7)
Hgb: 9.6 g/dL — ABNORMAL LOW (ref 11.4–14.8)
MCH: 31.8 pg (ref 25.1–33.5)
MCHC: 33 g/dL (ref 31.5–35.8)
MCV: 96.4 fL — ABNORMAL HIGH (ref 78.0–96.0)
MPV: 12.3 fL (ref 8.9–12.5)
Nucleated RBC: 0 /100 WBC (ref 0.0–0.0)
Platelets: 196 10*3/uL (ref 142–346)
RBC: 3.02 10*6/uL — ABNORMAL LOW (ref 3.90–5.10)
RDW: 12 % (ref 11–15)
WBC: 11.43 10*3/uL — ABNORMAL HIGH (ref 3.10–9.50)

## 2018-09-22 LAB — HEMOLYSIS INDEX: Hemolysis Index: 10 (ref 0–18)

## 2018-09-22 LAB — GFR: EGFR: >60

## 2018-09-22 MED ORDER — OXYCODONE HCL 5 MG PO TABS
5.00 mg | ORAL_TABLET | ORAL | 0 refills | Status: AC | PRN
Start: 2018-09-22 — End: 2018-09-29

## 2018-09-22 MED ORDER — GABAPENTIN 300 MG PO CAPS
300.0000 mg | ORAL_CAPSULE | Freq: Three times a day (TID) | ORAL | 0 refills | Status: DC
Start: 2018-09-22 — End: 2024-08-03

## 2018-09-22 MED ORDER — CYCLOBENZAPRINE HCL 5 MG PO TABS
5.0000 mg | ORAL_TABLET | Freq: Three times a day (TID) | ORAL | 0 refills | Status: DC | PRN
Start: 2018-09-22 — End: 2024-08-03

## 2018-09-22 MED ORDER — GABAPENTIN 300 MG PO CAPS
300.00 mg | ORAL_CAPSULE | Freq: Three times a day (TID) | ORAL | Status: DC
Start: 2018-09-22 — End: 2018-09-24
  Administered 2018-09-22 – 2018-09-24 (×6): 300 mg via ORAL
  Filled 2018-09-22 (×6): qty 1

## 2018-09-22 NOTE — Progress Notes (Signed)
CM left VM to [facility]) and request for home health PT set up     CM continue to follow up with discharge needs    DISPO : home with [facility] PT    Sherryl Manges, RN/BSN  Case Management  Spectra x [phone]

## 2018-09-22 NOTE — Discharge Summary (Signed)
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 Degenerative lumbar spinal stenosis      Hospital Problems:  Active Problems:    Hyperlipemia    Dry eyes    GERD (gastroesophageal reflux disease)    Degenerative lumbar spinal stenosis      Problem Lists:  Patient Active Problem List   Diagnosis   . Atypical ductal hyperplasia of left breast   . Hyperlipemia   . Dry eyes   . GERD (gastroesophageal reflux disease)   . 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]  Spondylolisthesis of lumbar region [M43.16]  DDD (degenerative disc disease), lumbar [M51.36]      Consultations:   none    Procedures performed:   L4-S1 laminectomy/fusion    Hospital Course:   Satisfactory post op course    Discharge Medications:     Current Discharge Medication List      START taking these medications    Details   cyclobenzaprine (FLEXERIL) 5 MG tablet Take 1 tablet (5 mg total) by mouth 3 (three) times daily as needed for Muscle spasms  Qty: 30 tablet, Refills: 0      gabapentin (NEURONTIN) 300 MG capsule Take 1 capsule (300 mg total) by mouth every 8 (eight) hours  Qty: 60 capsule, Refills: 0      oxyCODONE (ROXICODONE) 5 MG immediate release tablet Take 1 tablet (5 mg total) by mouth every 4 (four) hours as needed for Pain  Qty: 30 tablet, Refills: 0         CONTINUE these medications which have NOT CHANGED    Details   acetaminophen (TYLENOL) 500 MG tablet Take 2 tablets by mouth as needed      BIOTIN PO Take 1,000 mg by mouth daily         cycloSPORINE  (RESTASIS) 0.05 % ophthalmic emulsion Place 1 drop into both eyes 2 (two) times daily      Estradiol-Norethindrone Acet 0.5-0.1 MG per tablet Take by mouth daily      famotidine (PEPCID) 20 MG tablet Take 20 mg by mouth 2 (two) times daily      simvastatin (ZOCOR) 20 MG tablet Take 20 mg by mouth daily     Refills: 3               Discharge Instructions:      Follow-up with Ree Edman, PA-C in 2 weeks     Signed by: Ree Edman, PA-C

## 2018-09-22 NOTE — Progress Notes (Signed)
C/o rash on only both arm but no itching   Offered Benedryl but pt refused   Pt wanted to keep Katamine   No other symptoms of Katamine side effect   Pt wanted to wait and see   Unit charge nurse notified   Will monitor pt's rash

## 2018-09-22 NOTE — Progress Notes (Signed)
KETAMINE CONTINUOUS INFUSION PROGRESS NOTE  ==============================================  Date:  09/22/2018  Time: 8:52 AM      Patient Name: Carolyn [name],Carolyn [name]    Surgical Procedure(s):   Post-op Day: 1 Day Post-Op    Ketamine Assessment:  Ketamine Infusion Rate: 3 mcg/kg/min  Pain Score: 4/10 - recorded  Days of Ketamine: 1  Infusion Side Effects: denies any c/o side effects. Notes sore on tongue  Comments: some sleep last night, reports pain at rest and sitting as mild, increases with activity but very tolerable, had pain radiating down left posterior buttock - somewhat increased following surgery    Plan:  Please feel free to call the Pain Service numbers below with any questions.  Continue current regimen.  Consider initiating gabapentin at 200 -300 mg Q 8 today.  Will plan to d/c ketamine in early am tomorrow if plan is for discharge tomorrow and no adverse effects from ketamine noted today.  Follow up in AM  ------------------------------------------------------------------------------------------------------------------  Melene Plan MD  Department of Anesthesiology  [facility]  [facility]  Off Hours General Phone: [phone]  Pain Nurse (8am - 4pm): [phone] or [phone]  Pain Service Daytime Physician: [phone]

## 2018-09-22 NOTE — Progress Notes (Signed)
Spine navigator spoke to patient regarding plan of care, patient states her pain is well managed with ketamine slight blurred vision but is getting better. Patient has ambulated, voided and understands plan of care, spine precautions reviewed, IS teaching done and ankle pump teaching done. dsg instructions reviewed and family at bedside understands all instructions. No further questions

## 2018-09-22 NOTE — Progress Notes (Signed)
KETAMINE CONTINUOUS INFUSION PROGRESS NOTE  ==============================================  Date:  09/22/2018  Time: 1:38 PM      Patient Name: Palmer,Jonel N    Surgical Procedure(s):   Post-op Day: 1 Day Post-Op    Ketamine Assessment:  Ketamine Infusion Rate: 3 mcg/kg/min  Pain Score: 4/10   Days of Ketamine: 1  Infusion Side Effects: no side effects.    Plan:  Continue current regimen.  Start gabapentin at 200 -300 mg Q 8 today.  Please feel free to call the Pain Service numbers below with any questions..  Follow up in AM  ------------------------------------------------------------------------------------------------------------------  Avanell Shackleton MD  Department of Anesthesiology  [facility]  [facility]  Off Hours General Phone: [phone]  Pain Nurse (8am - 4pm): [phone] or [phone]  Pain Service Daytime Physician: [phone]

## 2018-09-22 NOTE — Op Note (Signed)
Procedure Date: 09/21/2018     Patient Type: I     SURGEON: Clelia Schaumann MD  ASSISTANT:       FIRST ASSISTANT:  Daralene Milch     PREOPERATIVE DIAGNOSES:  1.  Lumbar spinal stenosis, L4-L5, L5-S1, with neurogenic claudication.  2.  Degenerative spondylolisthesis, L4-L5.  3.  Degenerative disk disease, L4-L5, L5-S1.  4.  Facet arthropathy L4-L5, L5-S1.     POSTOPERATIVE DIAGNOSES:  1.  Lumbar spinal stenosis, L4-L5, L5-S1, with neurogenic claudication.  2.  Degenerative spondylolisthesis, L4-L5.  3.  Degenerative disk disease, L4-L5, L5-S1.  4.  Facet arthropathy L4-L5, L5-S1.     TITLE OF PROCEDURES:  1.  Laminectomy L4, L5, and S1.  2.  Posterolateral fusion, L4 to S1.  3.  Spinal instrumentation, L4 to S1 [employer] system).  4.  Bone marrow aspiration, iliac crest.  5.  Bone grafting (local laminar fragments, cortical cancellous allograft,  bone marrow aspirate, and osteoconductive sponge).  6.  Administration of 2 g of Ancef.      ANESTHESIA:  General.     COMPLICATIONS:  None.      DRAINS:  One Jackson-Pratt to the subfascial space.     FINDINGS:  Severe stenosis at L4-L5, with a large synovial cyst very adherent,  primarily on the left side.     INDICATIONS FOR PROCEDURE:  The patient is a 77 year old lady with back and leg pain which has been  unresponsive to nonoperative measures, who presents now for surgery.   Preoperatively, she was counseled regarding the indications for procedure  and potential complications.  She is also aware that she has substantial  degenerative changes in the upper portion of her lumbar spine, but her  symptoms did not appear to be attributed to this at this time.  She is  aware of the potential need to address this in the future.     DESCRIPTION OF PROCEDURE:  The patient received prophylactic antibiotics in the holding area.  She was  then brought to the operating room.  After the induction of anesthesia, the  patient was hooked up for neurological monitoring.  Sequential  compression  devices were applied to both legs and a Foley catheter was placed.  The  patient was then carefully placed in the prone position on the [facility]  table.  Care was taken to ensure her abdomen was free of compression.  Her  position was appropriate.  All bony prominences were well padded.  The back  was prepped and draped in usual fashion.  After a surgical pause, a  vertical midline incision was made, dissection carried sharply through the  skin and subcutaneous tissue.  The paraspinal musculature was carefully and  meticulously mobilized out to the tips of transverse processes from L4 to  the sacrum.  Excellent exposure was obtained.  Deep retractors were placed  in the wound.  The intertransverse region was well exposed and  epinephrine-soaked sponges were placed there.  A marker was placed and a  lateral fluoroscopic view was taken, confirming we were at L5-S1.  The  marker was removed.  A trocar was introduced into the right iliac crest and  50 mL of bone marrow was aspirated in standard fashion.  The bone marrow  was subsequently used to augment the fusion grafting materials.  The trocar  was withdrawn.  The spinous processes of L4, L5, and S1 were then resected.   A central laminectomy was initiated with a Leksell rongeur and  a wide  decompressive laminectomy was performed through all of L5, all of L4, and  the superior aspect of S1 and distal to the level of the S1 pedicles.  The  bone taken during this decompression was cleared of soft tissue.  This  required meticulous dissection also at the L4-L5 level, the level of the  disk space where she was severely stenotic, with marked hypertrophy of the  facet joints and the synovial cysts which required mobilization.  The dura  was not injured.  This decompression involved bilateral partial  facetectomies at L4-L5 and L5-S1 and foraminotomies at L4 and L5.  At the  completion of this decompression, the L4, L5, and S1 nerve roots were free  of  impingement.  A Woodson elevator could follow each bilaterally, with no  evidence of impingement.  Beginning on the left, the pedicles of L4, L5,  and S1 were entered with a Midas Rex, hand probed, tapped, probed with a  pedicle feeler, and screws were inserted at L4, L5, and S1 on the left.   These screws were stimulated, with no evidence of nerve irritation.  AP and  lateral fluoroscopic views revealed satisfactory positioning.  This was  repeated on the right side, with screws being inserted on the right side in  this exact same fashion, the same size at L4, L5, and S1.  AP and lateral  fluoroscopic views revealed satisfactory position of the hardware.  A  meticulous decortication was then performed bilaterally at the transverse  processes of L4, L5, and the sacral ala were decorticated.  This was  followed by decortication of the pars interarticularis of L4 and L5, and  the facet joints of L4-L5, L5-S1 were decorticated.  A precut lordotic rod was placed into the screw heads, locked and  tightened down with set screws and a torque wrench in usual fashion.  The  wound was irrigated once again.  The exposed dura was then covered with  patties.  The bone graft materials were placed into the intertransverse region.   This was cortical cancellous allograft, bone marrow aspirate,  osteoconductive sponge, and local bone.  Copious graft materials were  placed.  The patties were removed.  The canal was checked.  There was no  evidence of any bone graft migration into the canal.  The exposed dura was  covered with Avitene slurry and then Gelfoam.  A Jackson-Pratt drain was  placed in the subfascial space after ensuring that each of the set screws  had been tightened with a torque wrench over the construct.  The wound was  closed at the level of the fascia with a running number 1 Vicryl in a  figure-of-eight stitch over a Jackson-Pratt drain.  Subcutaneous tissue was  repaired with 2-0 Vicryl.  The skin was closed in a  subcuticular fashion.   A sterile dressing was applied.  The patient tolerated the procedure well.   There were no complications.  She was transferred to her bed and then to  recovery room without incident.           D:  09/22/2018 06:01 AM by Dr. Edman Circle. Marjo Bicker, MD ([id number])  T:  09/22/2018 06:41 AM by NTS      (Conf: [id number]) (Doc ID: [id number])

## 2018-09-22 NOTE — UM Notes (Signed)
UTILIZATION REVIEW CONTACT: Name: Alfonso Patten RN, MSN, CCM, ACM  Clinical Case Manager  - Utilization Review  [facility]  Address:  [address] [place], [place]  [address]  NPI:   [id number]  Tax ID:  [id number]  Phone: [phone]  Fax: [phone]  Email: [email or web address]    Please use fax number [phone] to provide authorization for hospital services or to request additional information.        PATIENT NAME: Carolyn [name]   DOB: 07/02/1941   PMH:  has a past medical history of Abnormal Pap smear, Dry eyes, Hyperlipidemia, Low back pain, Osteoarthritis, and Pelvic mass in female (07/05/2014).  PSH:  has a past surgical history that includes Breast surgery (01/04/09); Colposcopy; Dilation and curettage of uterus (10/2013); Colonoscopy (2014); Bunionectomy (Left, 06/2016); and Joint replacement (Right, 06/2017).     09/21/18 1102  Admit to Inpatient          Admission date: 09/21/2018  Pt is a 77 y.o. female who arrived at [facility] for elective surgery/procedure. 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]  Spondylolisthesis of lumbar region [M43.16]  DDD (degenerative disc disease), lumbar [M51.36]    Surgical Procedure(s) planned/completed: Procedure(s):  LAMINECTOMY, POSTERIOR LUMBAR, DECOMPRESSION, FUSION LEVEL 2    ----------------------------------------------------  09/22/18, [facility] 8    Procedure(s) with comments:  LAMINECTOMY, POSTERIOR LUMBAR, DECOMPRESSION, FUSION LEVEL 2 - L4-S1 LAMINECTOMY AND FUSION    1 Day Post-Op  -------------------   RA, Regular diet, +[name]/V received phenergan IV   Pending PT/OT, no foley  JP/Hemovac Drains: 290  Ketamine gtt, Oxycodone PO, dilaudid IV for pain     Visit Vitals  BP 105/57   Pulse 78   Temp (!) 96 F (35.6 C) (Oral)   Resp 18   Ht 1.626 m (5' 4.02")   Wt 66.6 kg (146 lb 13.2 oz)   LMP 01/19/1999   SpO2 97%   BMI 25.19 kg/m     I/O  last 3 completed shifts:  In: 2100 [I.V.:2100]  Out: 1630 [Urine:1040; Drains:290; Blood:300]    Labs:  WBC 11.43 (H)   Hemoglobin 9.6 (L)   Hematocrit 29.1 (L)   RBC 3.02 (L)     Medications:   Current Facility-Administered Medications   Medication Dose Route Frequency   . acetaminophen  1,000 mg Oral Q8H SCH   . ceFAZolin  2 g Intravenous Q8H   . senna-docusate  1 tablet Oral BID     . sodium chloride 125 mL/hr at 09/22/18 0522   . ketamine (KETALAR) infusion 200 mg/100 mL (Spine) 3 mcg/kg/min (09/22/18 0254)     PRN meds: phenergan IV x1, dilaudid IV x1       Primary Payor: OUT OF STATE BLUE CROSS / Plan: BCBS OUT OF STATE PPO / Product Type: BCBS /   NOTES TO REVIEWER:    This clinical review is based on/compiled from documentation provided by the treatment team within the patient's medical record.

## 2018-09-22 NOTE — Plan of Care (Signed)
Problem: Lumbar Discectomy/Laminectomy/Fusion  Goal: Free from infection  Outcome: Progressing  Flowsheets (Taken 09/22/2018 0354)  Free from infection : Monitor/assess vital signs;Assess for signs and symptoms of infection;Assess surgical dressing, reinforce or change as needed per order;Administer and discontinue antibiotics per SCIP measures  Goal: Hemodynamic stability  Outcome: Progressing  Flowsheets (Taken 09/22/2018 0354)  Hemodynamic Stability: Monitor/assess vital signs;Monitor SpO2 and treat as needed;Monitor intake and output.  Notify LIP if urine output is less than 240 mL in 8 hours.  Goal: Stable neurovascular status  Outcome: Progressing  Flowsheets (Taken 09/22/2018 0354)  Stable neurovascular status: Monitor/assess neurovascular status (pulses, capillary refill, pain, paresthesia, presence of edema);Teach/review/reinforce ankle pump exercises;VTE Prevention: administer anticoagulant(s) and/or apply anti-embolism stockings/devices as ordered  Goal: Mobility/activity is maintained at optimum level for patient  Outcome: Progressing  Flowsheets (Taken 09/22/2018 0354)  Mobility/activity is maintained at optimum level: Log roll while in bed;Keep HOB at least 30 degrees;Teach/reinforce/apply spinal orthotic before ambulation as ordered  Goal: Pain at adequate level as identified by patient  Outcome: Progressing  Flowsheets (Taken 09/22/2018 0354)  Pain at adequate level as identified by patient: Identify and evaluate patient comfort function goal;Administer analgesics as ordered to achieve patient comfort function goal;Administer muscle relaxer as prescribed

## 2018-09-22 NOTE — OT Eval Note (Signed)
[facility]   Occupational Therapy Evaluation     Patient: Carolyn [name]    MRN#: [id number]   Unit: [facility] TOWER 8  Bed: F825/F825.01                                     Discharge Recommendations:   Discharge Recommendation: Home with supervision(and assistance from family for higher level adls)     DME Recommended for Discharge: Front wheel walker RTS        Assessment:   Carolyn [name] is a 77 y.o. female admitted 09/21/2018.   Brief chart review completed including review of labs, review of imaging, review of H&P and physician progress notes, review of OR report and review of consulting physician notes .  Pt's ability to complete ADLs and functional transfers is impaired due to the following deficits:  decreased activity tolerance, decreased balance, decreased bed mobility and spine precautions.  Pt demonstrates performance deficits with bathing, dressing, toileting and functional mobility. There are a few comorbidities or other factors that affect plan of care and require modification of task including: home alone for a portion of the day.  Pt would continue to benefit from OT to address these deficits and increase functional independence.      Impairments: Assessment: decreased independence with ADLs      Therapy Diagnosis: Decreased adl I    Rehabilitation Potential: Prognosis: Good;With continued OT s/p acute discharge     Treatment Activities: decreased adl I  Educated the patient to role of occupational therapy, plan of care, goals of therapy and HEP, safety with mobility and ADLs.    Plan:   OT Frequency Recommended: 4-5x/wk     Treatment Interventions: ADL retraining;Functional transfer training;Patient/Family training;Equipment eval/education     Risks/benefits/POC discussed w/ patient          Precautions and Contraindications:   Precautions  Weight Bearing Status: no restrictions  Spinal Precautions: no bending, no twisting, no lifting  Other Precautions: fall      Consult  received for Carolyn [name] for OT Evaluation and Treatment.  Patient's medical condition is appropriate for Occupational Therapy intervention at this time.    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 Abnormal Pap smear     >20 years ago, colpo, NL since   . Dry eyes     drops daily   . Hyperlipidemia     controlled w/ medication   . Low back pain    . Osteoarthritis     back, knees, hands   . Pelvic mass in female 07/05/2014    Pelvic MRI 2015 Stable exam. 2. Simple cyst in the left mesorectal fat. 3. Proteinaceous cyst in the lower vagina representing a cyst of either Skenes duct or Bartholins gland duct. 4. Submucosal fibroid.      Past Surgical History:   Procedure Laterality Date   .  BREAST SURGERY  01/04/09    Left Stereotactic Biopsy    . BUNIONECTOMY Left 06/2016   . COLONOSCOPY  2014   . COLPOSCOPY      >20 years ago    . DILATION AND CURETTAGE OF UTERUS  10/2013   . JOINT REPLACEMENT Right 06/2017    knee         Imaging/Tests/Labs:  Fluoroscopy Less Than 1 Hour    Result Date: 09/21/2018   Fluoroscopy provided. Prince Solian, MD 09/21/2018 4:13 PM      Social History:   Prior Level of Function:  Prior level of function: Independent with ADLs, Ambulates independently  Baseline Activity Level: Community ambulation  Driving: independent  Cooking: Yes  Employment: Retired  DME Currently at [employer]: Medical laboratory scientific officer, Scientist, research (life sciences), Environmental consultant, Standard    Home Living Arrangements:  Living Arrangements: Spouse/significant  other  Type of Home: Apartment  Home Layout: One level, Able to live on main level with bedroom/bathroom, Performs ADL's on one level, Stairs to enter with rails (add number in comment)  Bathroom Shower/Tub: Pension scheme manager: Standard  Bathroom Equipment: Biomedical scientist: Accessible  DME Currently at Home: Cane, Single Point, Environmental consultant, Standard      Subjective:I feel better sitting up in the chair.     Patient is agreeable to participation in the therapy session.     Pain:4/10  Back  Pre medicated via nsg    Patient goal- home              Objective:   Inspection/Posture  Inspection/Posture: semisupine in bed    Observation of Patient/Vital Signs:  Patient is in chair with dressings, SCD's and peripheral IV, JP Drain in place.    Cognitive Status and Neuro Exam:     intact            Musculoskeletal Examination  Gross ROM  Right Upper Extremity ROM: within functional limits  Left Upper Extremity ROM: within functional limits    Gross Strength  Right Upper Extremity Strength: within functional limits  Left Upper Extremity Strength: within functional limits    Tone  Tone: within functional limits         Sensory/Oculomotor Examination        Touch- intact  Vision- intact    Activities of Daily Living  Self-care and Home Management  Eating: Setup  Grooming: setup  Bathing: Minimal Assist  UB Dressing: set up  LB Dressing: Minimal Assist  Toileting: Minimal Assist  Functional Transfers: Contact Guard Assist    Functional Mobility:  Mobility and Transfers  Supine to Sit: Minimal Assist  Sit to Supine: Minimal Assist  Sit to Stand: Minimal Assist  Bed to Chair: Minimal Assist     PMP Activity: Step 6 - Walks in Room     Balance  Balance  Static Sitting Balance: good  Dyanamic Sitting Balance: fair    Participation and Activity Tolerance  Participation and Endurance  Participation Effort: good  Endurance: Tolerates 10 - 20 min exercise with multiple rests    Patient left with call bell  within reach, all needs met, SCDs on, fall mat on, bed alarm off, chair alarm on and all questions answered. RN notified of session outcome and patient response.       Goals:  Time For Goal Achievement: 3 visits  ADL Goals  Patient will dress lower body: Modified Independent  Patient will toilet: Modified Independent  Mobility and Transfer Goals  Pt will  perform functional transfers: Modified Independent                                Time of treatment:   OT Received On: 09/22/18  Start Time: 1510  Stop Time: 1546  Time Calculation (min): 36 min          Tora Perches OTR/L  Pager [phone]

## 2018-09-22 NOTE — PT Progress Note (Signed)
Physical Therapy Note    [facility]   Physical Therapy Cancellation Note      Patient:  Carolyn [name] MRN#:  [id number]  Unit:  [facility] TOWER 8 Room/Bed:  F825/F825.01    09/22/2018  Time: 1200      Pt not seen for physical therapy secondary to Pt declined 2/2 sitting on eob eating lunch.  Pt educated that PT will attempt to return later this afternoon.  Carron Brazen, DPT  Pager # [phone]

## 2018-09-22 NOTE — Plan of Care (Signed)
Procedure(s) with comments:  LAMINECTOMY, POSTERIOR LUMBAR, DECOMPRESSION, FUSION LEVEL 2 - L4-S1 LAMINECTOMY AND FUSION    1 Day Post-Op  -------------------         Patient Lines/Drains/Airways Status    Active Lines, Drains and Airways     Name:   Placement date:   Placement time:   Site:   Days:    Peripheral IV 09/21/18 Right Forearm   09/21/18    1131    Forearm   less than 1    Peripheral IV 09/21/18 Left Forearm   09/21/18    1207    Forearm   less than 1    Closed/Suction Drain Inferior;Right;Midline Back Bulb   09/21/18    -    Back   1    Urethral Catheter Latex 16 Fr.   09/21/18    1200    Latex   less than 1                  Assessment  Patient is alert and oriented x4.  Calm, cooperative. Rings and follows commands appropriately.   Neurovascular intact. Good Dorsi plantar/flexion.   Ambulates 1-assist with walker.  Vital signs stable.  Patient rates pain 5/10. Current Pain regimen: oxycodone prn, dilaudid IV prn.  Ketamine 3 mcg/kg/min continuous drip.   Offered pharmacological and non-pharmacological inverventions. Re-assessing patient PRN.  Offering toileting. Encouraging adequate PO intake.  Incision site intact. Dressing is dry and intact.  Bed alarm on. Call bell within reach.   Will continue to monitor for any significant changes.     Notable Shift Events  Patient OOB with 1-assist to BSC, night of OR. Tolerated well.   Pain controlled.      Most Recent Set of Vitals     Visit Vitals  BP 139/67   Pulse 77   Temp 97 F (36.1 C)   Resp 20   Ht 1.626 m (5' 4.02")   Wt 66.6 kg (146 lb 13.2 oz)   LMP 01/19/1999   SpO2 99%   BMI 25.19 kg/m       Most Recent Lab Results  Lab Results   Component Value Date/Time    HGB 12.4 12/26/2008 03:30 PM    HCT 37.0 12/26/2008 03:30 PM    GLU 80 12/26/2008 03:30 PM    BUN 19 12/26/2008 03:30 PM    CREAT 1.0 12/26/2008 03:30 PM    NA 136 12/26/2008 03:30 PM    K 4.7 12/26/2008 03:30 PM    CA 9.5 12/26/2008 03:30 PM

## 2018-09-22 NOTE — PT Eval Note (Signed)
[facility]   Physical Therapy Evaluation   Patient: Carolyn [name]    MRN#: [id number]   Unit: [facility] TOWER 8  Bed: F825/F825.01    Discharge Recommendations:   Discharge Recommendation: Home with supervision, Home with home health PT   DME Recommendation:      If Home with supervision, Home with home health PT recommended discharge disposition is not available, patient will need CGA/SBA for bed mobility and sit to stand, with min/mod A for amb due to balance/safety deficits. Pt will also need eval of AD and strength and conditioning for for improved safe functional mobility.        Recommendations can change; please see most recent physical therapy treatment note for updates.   Assessment:   Carolyn [name] is a 77 y.o. female admitted 09/21/2018 for L4-S1 laminectomy/fusion, pt is POD#1. Pt reports pain is minimal 2/10. Pt required education on spinal precautions requiring continued VC to abide by precautions, specifically bending when going to sit & twisting with FWW. After education on supine to sit with log roll pt displayed safe transfer while abiding by precautions throughout with CGA. Pt's dynamic sitting and static standing with FWW balance is good. Pt displayed moderate LOB initially with amb with FWW that decreased to min with deviations more pronounced to the L requiring min/mod A to maintain safe upright. Pt required continued VC when turning to keep FWW on floor with small turns to abide by precautions with minimal carryover. Pt was able to stand at counter for 30 sec while brushing teeth with no UE support and no LOB noted. Pt would benefit from OT evaluation to help abide by precautions during ADL's. Pt would benefit from stair training if appropriate next session.    Pt's functional mobility is impacted by:  decreased activity tolerance, decreased balance, decreased bed mobility, gait impairment, decreased judgment, decreased safety awareness, sensation deficits, spine  precautions, decreased strength and transfers .  There are a few comorbidities or other factors that affect plan of care and require modification of task including: has stairs to manage and home alone for a portion of the day.  Standardized tests and exams incorporated into evaluation include AMPAC mobility, balance, cognition/orientation, ROM  and Strength.  Pt demonstrates a stable clinical presentation.  Pt would continue to benefit from PT to address these deficits and increase functional independence.    Impairments: Assessment: Decreased LE strength;Decreased safety/judgement during functional mobility;Decreased endurance/activity tolerance;Decreased sensation;Decreased functional mobility;Decreased balance;Gait impairment.     Therapy Diagnosis: decreased bed mobility and transfer ability, decreased activity tolerance/endurance, decreased balance, decreased strength, abnormalities of gait    Rehabilitation Potential: Prognosis: Good;With continued PT status post acute discharge    Treatment Activities: PT Eval, bed mobility and transfer training, activity tolerance/endurance training, balance training, AD Eval, gait training    Educated the patient to role of physical therapy, plan of care, goals of therapy and safety with mobility and ADLs, energy conservation techniques, spine precautions.    Plan:   Treatment/Interventions: Gait training, Exercise, Functional transfer training, Neuromuscular re-education, LE strengthening/ROM, Endurance training, Patient/family training, Bed mobility         Risks/Benefits/POC Discussed with Pt/Family: With patient/family        Precautions and Contraindications:   Weight Bearing Status: no restrictions  Spinal Precautions: no bending, no twisting, no lifting  Other Precautions: fall    Consult received for Carolyn [name] for PT Evaluation and Treatment.  Patient's medical condition is appropriate  for Physical therapy intervention at this time.    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 female admitted on 09/21/2018 with L4-S1 laminectomy/fusion   -Per MD Note    Past Medical/Surgical History:  Past Medical History:   Diagnosis Date   . Abnormal Pap smear     >20 years ago, colpo, NL since   . Dry eyes     drops daily   . Hyperlipidemia     controlled w/ medication   . Low back pain    . Osteoarthritis     back, knees, hands   . Pelvic mass in female 07/05/2014    Pelvic MRI 2015 Stable exam. 2. Simple cyst in the left mesorectal fat. 3. Proteinaceous cyst in the lower vagina representing a cyst of either Skenes duct or Bartholins gland duct. 4. Submucosal fibroid.         Past Surgical History:   Procedure Laterality Date   . BREAST SURGERY  01/04/09    Left Stereotactic Biopsy    . BUNIONECTOMY Left 06/2016   . COLONOSCOPY  2014   . COLPOSCOPY      >20 years ago    . DILATION AND CURETTAGE OF UTERUS  10/2013   . JOINT REPLACEMENT Right 06/2017    knee        WBC   Date Value Ref Range Status   09/22/2018 11.43 (H) 3.10 - 9.50 x10 3/uL Final   12/26/2008 8.47 3.50 - 10.80 /CUMM Final     RBC   Date Value Ref Range Status   09/22/2018 3.02 (L) 3.90 - 5.10 x10 6/uL Final     Hgb   Date Value Ref Range Status   09/22/2018 9.6 (L) 11.4 - 14.8 g/dL Final     Hematocrit   Date Value Ref Range Status   09/22/2018 29.1 (L) 34.7 - 43.7 % Final     MCV   Date Value Ref Range Status   09/22/2018 96.4 (H) 78.0 - 96.0 fL Final        X-Rays/Tests/Labs:  Fluoroscopy Less Than 1 Hour    Result Date: 09/21/2018   Fluoroscopy provided. Prince Solian, MD 09/21/2018 4:13 PM       Social History:   Prior Level of Function:  Prior level of function: Independent with ADLs, Ambulates independently  Baseline Activity Level: Community ambulation  Driving: independent  Cooking:  Yes  Employment: Retired  DME Currently at [employer]: Medical laboratory scientific officer, Scientist, research (life sciences), Environmental consultant, Standard    Home Living Arrangements:  Living Arrangements: Spouse/significant other  Type of Home: Apartment  Home Layout: One level, Able to live on main level with bedroom/bathroom, Performs ADL's on one level, Stairs to enter with rails (add number in comment)  Bathroom Shower/Tub: Pension scheme manager: Standard  Bathroom Equipment: Biomedical scientist: Accessible  DME Currently at Home: Cane, Single Point, Environmental consultant, Standard    Subjective:   Patient is agreeable to participation in the therapy session. Nursing clears patient for therapy.     Patient Goal: Patient wants to get back to where she was    Pain Assessment  Pain Assessment: Numeric Scale (0-10)  Pain Score: (1-2/10)  Pain Location: Back;Leg  Pain Orientation: (no speific location ack, L posterior leg  to back of knee)    Objective:   Observation of Patient/Vital Signs:  Patient is in bed with SCD's and peripheral IV, JP Drain in place.    Observation of Patient/Vital signs:  Inspection/Posture: semisupine in bed    Cognition/Neuro Status  Arousal/Alertness: Appropriate responses to stimuli  Attention Span: Appears intact  Orientation Level: Oriented to person;Oriented to situation;Oriented to time;Oriented to place  Memory: Appears intact  Following Commands: Follows one step commands with repetition  Safety Awareness: moderate verbal instruction(for use w/ FWW, abiding by precautions, hand placement)  Insights: Decreased awareness of deficits(balance deficits)  Problem Solving: Assistance required to generate solutions;Assistance required to identify errors made;minimal assistance  Behavior: calm;impulsive  Motor Planning: intact    Neuro Exam:  Sensation: overall intact, pt reported toes feeling numb, however pt able to sense light touch in bilat feet toes when tested  Vision: intact    Musculoskeletal Examination:  Gross ROM  Neck/Trunk ROM: within  functional limits  Right Upper Extremity ROM: within functional limits  Left Upper Extremity ROM: within functional limits  Right Lower Extremity ROM: within functional limits  Left Lower Extremity ROM: within functional limits    Gross Strength  Neck/Trunk Strength: WFL  Right Upper Extremity Strength: 4-/5  Left Upper Extremity Strength: 4-/5  Right Lower Extremity Strength: within functional limits(knee ext 4/5, DF 4-/5, hip add  & abd 4/5, hip flex 4-/5 )  Left Lower Extremity Strength: within functional limits(knee ext 4/5, DF 3+/5, hip add & abd 4/5, hip flex 4-/5)    Tone  Tone: within functional limits    Functional Mobility:  Rolling: Logroll;to Left;Contact Guard Assist  Supine to Sit: Contact Guard Assist(VC for sequencing to push up with UE)  Scooting to EOB: Stand by Assist(VC to ensure pt safety)  Sit to Stand: Contact Guard Assist;with instruction for hand placement to increase safety  Stand to Sit: Stand by Assist(VC to reahc back to ensure pt safety)    Transfers  Lateral Transfers: Supervision(seated and standing)  Posterior Scoot Transfers: Supervision  Device Used for Functional Transfer: front-wheeled walker    Ambulation:  PMP - Progressive Mobility Protocol   PMP Activity: Step 6 - Walks in Room  Distance Walked (ft) (Step 6,7): 75 Feet    Ambulation: Minimal Assist;Moderate Assist;with front-wheeled walker  Pattern: L foot decreased clearance;R foot decreased clearance;decreased cadence;decreased step length;Step to;Step through(mild lateral lean L)     Balance:  Sitting - Static: Good  Sitting - Dynamic: Good(bilat UE support during ROM & strength test)  Standing - Static: Good(bilat UE support FWW)  Standing - Dynamic: Fair(moderate LOB to the L initially progress to min during amb)    Participation and Activity Tolerance:  Participation Effort: good  Endurance: Tolerates 30 min exercise with multiple rests    Patient left with call bell within reach, all needs met, SCDs on, fall mat down,  chair alarm on and all questions answered. RN notified of session outcome and patient response.     Goals:   Goals  Pt Will Roll Left: with supervision, 3 visits  Pt Will Go Supine To Sit: with supervision, 3 visits  Pt Will Perform Sit To Supine: with supervision, 3 visits  Pt Will Perform Sit to Stand: with supervision, 3 visits  Pt Will Ambulate: with rolling walker, 3 visits, 101-150 feet  Pt Will Go Up / Down Stairs: 1-2 stairs, with stand by assist, 3 visits     Marijean Bravo, PT, DPT, Pager [phone], 09/22/2018,  4:02 PM    Time of treatment:   PT Received On: 09/22/18  Start Time: 1355  Stop Time: 1445  Time Calculation (min): 50 min

## 2018-09-22 NOTE — Progress Notes (Signed)
PROGRESS NOTE    Date Time: 09/22/18 10:30 AM  Patient Name: Carolyn [name]  Attending Physician: Clelia Schaumann, MD    Assessment:   Stable.    Plan:   D/C to home expected tomorrow.  JP drain to be pulled prior to discharge.  Pt to be discharged on oxycodone, gabapentin and Flexeril.  Prescriptions printed, reviewed with nursing staff and placed in patient's chart.  Detailed discussion with the Pt regarding bathing/activity restrictions and dressing changes.  Pt asked pertinent questions and demonstrated comprehension.  Continue to increase activities as tolerated.  Follow up in office in two weeks.   Subjective:   Expected incisional pain.  Pt able to go from bed to chair with RW for assistance. Lower extremity strength is returning to baseline.  She notes some nerve mediated pain radiating along the LLE as far as the foot.  Dressing clean, dry and intact. JP drain remains in place at this time.  Pt states that pain is adequately managed.  Appetite is returning to normal.  Pt denies constitutional symptoms.  Labs and vitals are reassuring.  Pt has family support at home.  Patient is on track for discharge tomorrow.     Medications:     Current Facility-Administered Medications   Medication Dose Route Frequency   . acetaminophen  1,000 mg Oral Q8H SCH   . ceFAZolin  2 g Intravenous Q8H   . senna-docusate  1 tablet Oral BID       Physical Exam:     Vitals:    09/22/18 0636   BP: 105/57   Pulse: 78   Resp: 18   Temp: (!) 96 F (35.6 C)   SpO2: 97%         Intake/Output Summary (Last 24 hours) at 09/22/2018 1030  Last data filed at 09/22/2018 0800  Gross per 24 hour   Intake 2100 ml   Output 1700 ml   Net 400 ml       Drains:     Closed/Suction Drain Inferior;Right;Midline Back Bulb (Active)   Output (mL) 70 mL 09/22/2018  8:00 AM   Number of days: 1       Urethral Catheter Latex 16 Fr. (Active)   Number of days: 1         Labs:     Lab Results   Component Value Date    WBC 11.43 (H) 09/22/2018    HGB 9.6 (L)  09/22/2018    HCT 29.1 (L) 09/22/2018    MCV 96.4 (H) 09/22/2018    PLT 196 09/22/2018               Signed by: Luther Redo, PA-C  Date/Time: 09/22/18 10:30 AM

## 2018-09-22 NOTE — Plan of Care (Signed)
Pt AO x 4, follows commands   Incision on back   Dressing intact with small drainage.   Reinforced dressing by night nurse   JP drainage overnight was 290 according to night nurse   PA notified and will monitor drainage   C/o minimal pain with katamine drip ( )   No prn medication given   Walked to BR with 1 person assist   Need 1 person assist   Directed pt to use call bell whenever OOB due to unsteady gait   BM x 1 yesterday  No N/V   Some tingling on RLE   VSS without fever

## 2018-09-22 NOTE — Progress Notes (Signed)
KETAMINE CONTINUOUS INFUSION PROGRESS NOTE  ==============================================  Date:  09/23/2018  Time: 0645    Patient Name: Carolyn [name],Carolyn [name]    Surgical Procedure(s):   Post-op Day: 2 Day Post-Op    Ketamine Assessment:  Ketamine Infusion Rate: 3 mcg/kg/min  Pain Score: 4/10   Days of Ketamine: 2  Infusion Side Effects: reports few occurrences of nystagmus and "floating vision"    Plan:    D/C Ketamine   ------------------------------------------------------------------------------------------------------------------  Wetzel Bjornstad MD  Department of Anesthesiology  [facility]  [facility]  Off Hours General Phone: [phone]  Pain Nurse (8am - 4pm): [phone] or [phone]  Pain Service Daytime Physician: [phone]

## 2018-09-23 NOTE — Plan of Care (Signed)
Pt AO x 4, follows commands   Incision on back   Dressing c/d/I without drainage   Jp drain Jaylynn Mcaleer place   PA notified output   [place] Katamine Particia Strahm am   Started with Oxy 2.5 mg   Pain controlled   Walked to BR with 1 person assist   BM before surgery   Stool softer and MOM given Shawnique Mariotti am

## 2018-09-23 NOTE — Progress Notes (Addendum)
IPDA:Pt is a 77 y.o. female who arrived at [facility] for elective surgery/procedure. Approved IP procedure, AUTH# [id number]       S.LAMINECTOMY, POSTERIOR LUMBAR, DECOMPRESSION, FUSION LEVEL 2  Pod 2  B. Patient lives with spouse in home with elevator. Sister Erskine Squibb ) will stay with patient for a week post op . Patient was independent with all ADL/IADL prior to surgery .    A. Patient has PCP. Last seen MD two weeks ago for pre- op   Patient has insurance  Patient has good family support ( sister will stay with patient for a week post op )  Spouse will transport patient at discharge.    R. PT /OT recommended pt to d/c to home with supervision /DME   DME delivered in room .        09/23/18 1703   Patient Type   Within 30 Days of Previous Admission? No   Healthcare Decisions   Interviewed: Patient   Orientation/Decision Making Abilities of Patient Alert and Oriented x3, able to make decisions   Advance Directive Patient has advance directive, copy not in chart   Advance Directive not in Chart Copy requested from other (Comment)   (RETIRED) Healthcare Agent's Phone Number Faigy Stretch ( spouse)   Additional Emergency Contacts? [phone]   Prior to admission   Prior level of function Independent with ADLs;Ambulates independently   Type of Residence Private residence   Home Layout One level;Able to live on main level with bedroom/bathroom;Elevator   Have running water, electricity, heat, etc? Yes   Living Arrangements Spouse/significant other;Family members   How do you get to your MD appointments? sister assist   How do you get your groceries? sister assist   Who fixes your meals? sister assist   Who does your laundry? sister assist   Who picks up your prescriptions? sister assist   Dressing Needs assistance   Grooming Needs assistance   Feeding Independent   Bathing Needs assistance   Toileting Needs assistance   DME Currently at [facility], [place];[employer], [employer], [employer]   Adult Management consultant  (APS) involved? No   Discharge Planning   Support Systems Spouse/significant other;Family members   Patient expects to be discharged to:   (home with supervision)   Anticipated [place] plan discussed with: Same as interviewed   Potential barriers to discharge: Decreased mobility   Mode of transportation: Private car (family member)   Consults/Providers   PT Evaluation Needed 1   OT Evalulation Needed 1   SLP Evaluation Needed 2   Outcome Palliative Care Screen Screened but did not meet criteria for intervention   Correct PCP listed in Epic? Yes   PCP   PCP on file was verified as the current PCP? Yes   Important Message from Medicare Notice   Patient received 1st IMM Letter? n/a   Sherryl Manges, RN/BSN  Case Management  Spectra x [phone]

## 2018-09-23 NOTE — Progress Notes (Signed)
Spine navigator spoke to patient regarding plan of care, patient states her pain is well controlled, has been up ambulating, using IS, understands spine precautions. Cont to monitor drain, if Dr ok to pull drain per dr childs then [place] home. No further questions

## 2018-09-23 NOTE — Plan of Care (Signed)
Problem: Lumbar Discectomy/Laminectomy/Fusion  Goal: Free from infection  Outcome: Progressing  Flowsheets (Taken 09/22/2018 0354)  Free from infection : Monitor/assess vital signs;Assess for signs and symptoms of infection;Assess surgical dressing, reinforce or change as needed per order;Administer and discontinue antibiotics per SCIP measures  Goal: Hemodynamic stability  Outcome: Progressing  Flowsheets (Taken 09/22/2018 0354)  Hemodynamic Stability: Monitor/assess vital signs;Monitor SpO2 and treat as needed;Monitor intake and output.  Notify LIP if urine output is less than 240 mL in 8 hours.  Goal: Stable neurovascular status  Outcome: Progressing  Flowsheets (Taken 09/22/2018 0354)  Stable neurovascular status: Monitor/assess neurovascular status (pulses, capillary refill, pain, paresthesia, presence of edema);Teach/review/reinforce ankle pump exercises;VTE Prevention: administer anticoagulant(s) and/or apply anti-embolism stockings/devices as ordered  Goal: Mobility/activity is maintained at optimum level for patient  Outcome: Progressing  Flowsheets (Taken 09/22/2018 0354)  Mobility/activity is maintained at optimum level: Log roll while in bed;Keep HOB at least 30 degrees;Teach/reinforce/apply spinal orthotic before ambulation as ordered  Goal: Pain at adequate level as identified by patient  Outcome: Progressing  Flowsheets (Taken 09/22/2018 0354)  Pain at adequate level as identified by patient: Identify and evaluate patient comfort function goal;Administer analgesics as ordered to achieve patient comfort function goal;Administer muscle relaxer as prescribed

## 2018-09-23 NOTE — Plan of Care (Signed)
Procedure(s) with comments:  LAMINECTOMY, POSTERIOR LUMBAR, DECOMPRESSION, FUSION LEVEL 2 - L4-S1 LAMINECTOMY AND FUSION    2 Days Post-Op  -------------------         Patient Lines/Drains/Airways Status    Active Lines, Drains and Airways     Name:   Placement date:   Placement time:   Site:   Days:    Peripheral IV 09/21/18 Right Forearm   09/21/18    1131    Forearm   1    Peripheral IV 09/21/18 Left Forearm   09/21/18    1207    Forearm   1    Closed/Suction Drain Inferior;Right;Midline Back Bulb   09/21/18    -    Back   2    Urethral Catheter Latex 16 Fr.   09/21/18    1200    Latex   1                  Assessment  Patient is alert and oriented x4.  Calm, cooperative. Rings and follows commands appropriately.   Neurovascular intact. Good Dorsi plantar/flexion.   Ambulates 1-assist with walker.  Vital signs stable.  Patient rates pain 4/10. Current Pain regimen: Ketamine 3 mcg/kg/min, tylenol scheduled, oxycodone prn.   Offered pharmacological and non-pharmacological inverventions. Re-assessing patient PRN.  Offering toileting. Encouraging adequate PO intake.  Incision site intact. Dressing is dry and intact.  JP Drain in place.  Bed alarm on. Call bell within reach.   Will continue to monitor for any significant changes.     Notable Shift Events  Expected discharge 12/5.      Most Recent Set of Vitals     Visit Vitals  BP 116/60   Pulse 91   Temp 98.2 F (36.8 C) (Oral)   Resp 17   Ht 1.626 m (5' 4.02")   Wt 66.6 kg (146 lb 13.2 oz)   LMP 01/19/1999   SpO2 96%   BMI 25.19 kg/m       Most Recent Lab Results  Lab Results   Component Value Date/Time    HGB 9.6 (L) 09/22/2018 04:55 AM    HCT 29.1 (L) 09/22/2018 04:55 AM    GLU 143 (H) 09/22/2018 04:55 AM    BUN 11.0 09/22/2018 04:55 AM    CREAT 0.9 09/22/2018 04:55 AM    NA 137 09/22/2018 04:55 AM    K 4.8 09/22/2018 04:55 AM    CA 7.9 09/22/2018 04:55 AM

## 2018-09-23 NOTE — Progress Notes (Signed)
Spoke to Dr. Marjo Bicker, would like to keep patient tonight to monitor drain. Plan of care reviewed with patient. Patient ambulating with sister and husband in hallway. Doing well. No further questions

## 2018-09-23 NOTE — OT Progress Note (Signed)
Occupational Therapy Note    [facility]   Occupational Therapy Treatment     Patient: Carolyn [name]    MRN#: [id number]   Unit: [facility] TOWER 8  Bed: F825/F825.01      Discharge Recommendations:   Discharge Recommendation: Home with supervision(and assistance from family for higher level adls)     DME Recommended for Discharge: Front wheel walker      Assessment:   Tolerated session well. Patient presents with improved balance and adl I. LB dressing- mod I. Bed mobility- SBA. Educated patient and sister on safety at home with adls and car transfers. No further acute OT needs. D/C OT    Treatment Activities: Adl re training, functional mobility and transfers    Educated the patient to role of occupational therapy, plan of care, goals of therapy and HEP, safety with mobility and ADLs.    Plan:       Discharge from OT Acute Care Services.       Precautions and Contraindications:   Falls  Spine    Updated Medical Status/Imaging/Labs:  reviewed    Subjective:I am sleepy   Patient's medical condition is appropriate for Occupational Therapy intervention at this time.  Patient is agreeable to participation in the therapy session.    Pain:   Scale: 4/10  Location: spine  Intervention: pre medicated via nsg    Objective:   Patient is in bed with dressings, SCD's and peripheral IV in place.      Cognition  intact    Functional Mobility  Rolling:  nt  Supine to Sit: SBA  Sit to Stand: SBA w/ RW  Transfers:SBA w/ RW    PMP Activity: Step 4 - Dangle at Bedside    Balance  Static Sitting: Good  Dynamic Sitting: fair  Static Standing: fair  Dynamic Standing: fair    Self Care and Home Management  Eating: set up  Grooming: set up  Bathing: min a  UE Dressing: SBA  LE Dressing: SBA   Toileting: SBA    Therapeutic Exercises  W/ activity    Participation: good  Endurance: fair    Patient left with call bell within reach, all needs met, SCDs on, fall mat on, bed alarm on, chair alarm off and all questions  answered. RN notified of session outcome and patient response.     Goals:met at a simulated level  Time For Goal Achievement: 3 visits  ADL Goals  Patient will dress lower body: Modified Independent  Patient will toilet: Modified Independent  Mobility and Transfer Goals  Pt will perform functional transfers: Modified Independent                             Time of Treatment  OT Received On: 09/23/18  Start Time: 1050  Stop Time: 1113  Time Calculation (min): 23 min    Treatment # 1     Tora Perches OTR/L  Pager [phone]

## 2018-09-23 NOTE — PT Progress Note (Addendum)
Physical Therapy Note    [facility]   Physical Therapy Treatment/Discharge  Patient:  Carolyn [name] MRN#:  [id number]  Unit: [facility] TOWER 8  Bed: F825/F825.01    Discharge Recommendations:   D/C Recommendations: Home with supervision   DME Recommendations: Front wheel walker     Assessment:   Pt is a 77 y.o. female admitted 09/21/2018 for L4-S1 laminectomy/fusion, pt is POD#2. Pt reports pain is 5/10 when moving however, tolerable but subsides within seconds once resting. Pt required reeducation on spinal precautions however pt displayed adequate performance with log roll and supine to sit with mod I due to pt utilizing the bed rail. Pt performed sit < > stand with supervision requiring no VC to abide by precautions & no LOB or dizziness. Pt perform a stand pivot transfer into toilet transfer with supervision. Pt continues to need wall railing to descend however pt was able to abide by precautions throughout with no LOB. Pt amb with SBA with FWW displaying no LOB but required VC to avoid obstacles in pathway. Pt negotiated 10 stairs with use of 1 railing with min A/CGA. Pt required CGA assist going up but displayed min A due to occasional buckling upon descent. Pt educated on activity pacing and planned rest breaks once discharge. Pt educated on there ex to maintain and improve strength.    Assessment: Decreased LE strength, Decreased safety/judgement during functional mobility, Decreased balance, Gait impairment  Progress: Improving as expected  Prognosis: Good  Risks/Benefits/POC Discussed with Pt/Family: With patient/family  Patient left without needs and call bell within reach. RN notified of session outcome.     Treatment Activities: bed mobility, transfer training, sitting/standing tolerance, gait training, stair training, there ex    Educated the patient to role of physical therapy, plan of care, goals of therapy and HEP, safety with mobility and ADLs, energy conservation  techniques, spine precautions, discharge instructions, home safety.    Plan:   Treatment/Interventions: Exercise, Gait training, Stair training, Functional transfer training, LE strengthening/ROM, Endurance training, Patient/family training, Bed mobility        PT Frequency: therapy discontinued     Discharge from Acute Care Services.       Precautions and Contraindications:   Precautions  Spinal Precautions: no bending;no twisting;no lifting  Other Precautions: fall    Updated Medical Status/Imaging/Labs:       WBC   Date Value Ref Range Status   09/22/2018 11.43 (H) 3.10 - 9.50 x10 3/uL Final   12/26/2008 8.47 3.50 - 10.80 /CUMM Final     RBC   Date Value Ref Range Status   09/22/2018 3.02 (L) 3.90 - 5.10 x10 6/uL Final     Hgb   Date Value Ref Range Status   09/22/2018 9.6 (L) 11.4 - 14.8 g/dL Final     Hematocrit   Date Value Ref Range Status   09/22/2018 29.1 (L) 34.7 - 43.7 % Final     MCV   Date Value Ref Range Status   09/22/2018 96.4 (H) 78.0 - 96.0 fL Final     No change in pmhx, psh, rads since admission refer to inital eval for detail.    Subjective:   Patient Goal: Patient wants to get up and use the restroom    Pain Assessment  Pain Assessment: Numeric Scale (0-10)  Pain Score: 5-moderate pain    Patient's medical condition is appropriate for Physical Therapy intervention at this time.  Patient is agreeable to participation in  the therapy session. Nursing clears patient for therapy.    Objective:   Observation of Patient/Vital Signs:  Patient is in bed with peripheral IV, JP Drain in place.    Cognition/Neuro Status  Safety Awareness: minimal verbal instruction  Insights: Decreased awareness of deficits  Problem Solving: Assistance required to identify errors made;minimal assistance(maneuvering FWW & objects in pathway)  Behavior: impulsive;cooperative  Orientation Level: Oriented X4         Functional Mobility:  Rolling: Modified Independent;to Left;Logroll(using railing)  Supine to Sit: Modified  Independent;using bedrail  Scooting to EOB: Independent  Sit to Supine: Modified Independent;to Left(use of bed rail)  Sit to Stand: Supervision  Stand to Sit: Supervision  Transfers  Stand Pivot Transfers: Supervision  Posterior Scoot Transfers: Independent  Device Used for Functional Transfer: front-wheeled walker    Ambulation:  PMP - Progressive Mobility Protocol   PMP Activity: Step 4 - Dangle at Bedside  Distance Walked (ft) (Step 6,7): 150 Feet     Ambulation: Stand by Assist;with front-wheeled walker  Pattern: R foot decreased clearance;L foot decreased clearance;Step through;decreased cadence  Stair Management: Minimal Assist;one rail L;one Therapist, nutritional  Number of Stairs: 10(10 up and 10 down)  Door Management: Stand by Assist(VC to open door wide enough prior to trying to go through)    Therapeutic Exercise  Heelslides: 10  Hip Flexion: 10  Strengthening: (hip add 10, knee ext 10x)    Patient Participation: good  Patient Endurance: good    Patient left with call bell within reach, all needs met, SCDs off as initially found fall mat down, bed alarm on and all questions answered. RN notified of session outcome and patient response.     Goals:  Goals  Pt Will Roll Left: Goal met  Pt Will Go Supine To Sit: Goal met  Pt Will Perform Sit To Supine: Goal met  Pt Will Perform Sit to Stand: Goal met  Pt Will Ambulate: with rolling walker, 3 visits, 101-150 feet, with stand by assist  Pt Will Go Up / Down Stairs: 1-2 stairs, with stand by assist, 3 visits, Goal met    Marijean Bravo, PT, DPT, Pager [phone], 09/23/2018, 11:03 AM    Time of Treatment  PT Received On: 09/23/18  Start Time: 1010  Stop Time: 1040  Time Calculation (min): 30 min  Treatment # 1 out of  3

## 2018-09-23 NOTE — Progress Notes (Signed)
[facility] ([phone])  Rolling Walker delivered to patients room

## 2018-09-24 LAB — LAB USE ONLY - HISTORICAL SURGICAL PATHOLOGY

## 2018-09-24 NOTE — Progress Notes (Signed)
Patient A & O x 4, being discharged home. JP drain was removed by MD. Patient's IV removed prior to discharge. Vital signs stable. Patient received pain medication prior to discharge. Patient verbalized instructions of discharge instructions. Patient made follow up appointment within 2 weeks. Discharge via wheelchair transport.

## 2018-09-24 NOTE — Discharge Summary (Signed)
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 Degenerative lumbar spinal stenosis      Hospital Problems:  Active Problems:    Hyperlipemia    Dry eyes    GERD (gastroesophageal reflux disease)    Degenerative lumbar spinal stenosis      Problem Lists:  Patient Active Problem List   Diagnosis   . Atypical ductal hyperplasia of left breast   . Hyperlipemia   . Dry eyes   . GERD (gastroesophageal reflux disease)   . 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]  Spondylolisthesis of lumbar region [M43.16]  DDD (degenerative disc disease), lumbar [M51.36]  Degenerative lumbar spinal stenosis [M48.061]      Consultations:   none    Procedures performed:   L4-S1 laminectomy/fusion    Hospital Course:   Satisfactory post op course    Discharge Medications:     Current Discharge Medication List      START taking these medications    Details   cyclobenzaprine (FLEXERIL) 5 MG tablet Take 1 tablet (5 mg total) by mouth 3 (three) times daily as needed for Muscle spasms  Qty: 30 tablet, Refills: 0      gabapentin (NEURONTIN) 300 MG capsule Take 1 capsule (300 mg total) by mouth every 8 (eight) hours  Qty: 60 capsule, Refills: 0      oxyCODONE (ROXICODONE) 5 MG immediate release tablet Take 1 tablet (5 mg total) by mouth every 4 (four) hours as needed for Pain  Qty: 30 tablet, Refills: 0         CONTINUE these medications which have NOT CHANGED    Details   acetaminophen (TYLENOL) 500 MG tablet Take 2  tablets by mouth as needed      BIOTIN PO Take 1,000 mg by mouth daily         cycloSPORINE (RESTASIS) 0.05 % ophthalmic emulsion Place 1 drop into both eyes 2 (two) times daily      Estradiol-Norethindrone Acet 0.5-0.1 MG per tablet Take by mouth daily      famotidine (PEPCID) 20 MG tablet Take 20 mg by mouth 2 (two) times daily      simvastatin (ZOCOR) 20 MG tablet Take 20 mg by mouth daily     Refills: 3               Discharge Instructions:      Follow-up with Ree Edman, PA-C in 2 weeks     Signed by: Ree Edman, PA-C

## 2018-09-24 NOTE — Progress Notes (Signed)
PROGRESS NOTE    Date Time: 09/24/18 9:37 AM  Patient Name: Carolyn [name]  Attending Physician: Clelia Schaumann, MD    Assessment:   Stable.    Plan:   D/C to home today.  Prescriptions printed, reviewed with nursing staff and placed in patient's chart.  Detailed discussion with the Pt and her sister who is at bedside regarding bathing/activity restrictions and dressing changes.  Pt asked pertinent questions and demonstrated comprehension.  Continue to increase activities as tolerated.  Follow up in office in two weeks.   Subjective:   Expected incisional pain.  Pt able to go from bed to chair with RW for assistance.  Dressing clean, dry and intact. Dressing changed, wound clean. JP drain pulled. Pt states that pain is adequately managed on oral medications.  Appetite is returning to normal.  Pt denies constitutional symptoms.  Labs and vitals are reassuring.  Pt is appropriate for discharge today.    Medications:     Current Facility-Administered Medications   Medication Dose Route Frequency   . acetaminophen  1,000 mg Oral Q8H SCH   . gabapentin  300 mg Oral Q8H SCH   . senna-docusate  1 tablet Oral BID       Physical Exam:     Vitals:    09/24/18 0755   BP: 129/62   Pulse: 78   Resp: 16   Temp: 98.9 F (37.2 C)   SpO2: 96%         Intake/Output Summary (Last 24 hours) at 09/24/2018 0937  Last data filed at 09/24/2018 0755  Gross per 24 hour   Intake -   Output 270 ml   Net -270 ml       Drains:     Closed/Suction Drain Inferior;Right;Midline Back Bulb (Active)   Output (mL) 50 mL 09/24/2018  7:55 AM   Number of days: 3       [REMOVED] Urethral Catheter Latex 16 Fr. (Removed)   Number of days: 0         Labs:     Lab Results   Component Value Date    WBC 11.43 (H) 09/22/2018    HGB 9.6 (L) 09/22/2018    HCT 29.1 (L) 09/22/2018    MCV 96.4 (H) 09/22/2018    PLT 196 09/22/2018               Signed by: Luther Redo, PA-C  Date/Time: 09/24/18 9:37 AM

## 2018-09-24 NOTE — Plan of Care (Signed)
Problem: Lumbar Discectomy/Laminectomy/Fusion  Goal: Free from infection  Outcome: Progressing  Flowsheets (Taken 09/24/2018 0018)  Free from infection : Monitor/assess vital signs; Assess for signs and symptoms of infection; Teach/reinforce use of incentive spirometer 10 times per hour while awake, cough and deep breath as needed  Goal: Hemodynamic stability  Outcome: Progressing  Flowsheets (Taken 09/24/2018 0018)  Hemodynamic Stability: Monitor/assess vital signs; Monitor SpO2 and treat as needed; Monitor intake and output.  Notify LIP if urine output is less than 240 mL in 8 hours.; Monitor/assess output from surgical drain if present. Notify LIP if drainage from surgical drain exceeds 300 mL in 8 hours.  Goal: Stable neurovascular status  Outcome: Progressing  Flowsheets (Taken 09/24/2018 0018)  Stable neurovascular status: Monitor/assess neurovascular status (pulses, capillary refill, pain, paresthesia, presence of edema); VTE Prevention: administer anticoagulant(s) and/or apply anti-embolism stockings/devices as ordered; Teach/review/reinforce ankle pump exercises  Goal: Mobility/activity is maintained at optimum level for patient  Outcome: Progressing  Flowsheets (Taken 09/24/2018 0018)  Mobility/activity is maintained at optimum level: Log roll while in bed; Teach/review/reinforce spinal precautions with patient/patient care companion; Teach/reinforce/apply spinal orthotic before ambulation as ordered; Keep HOB at least 30 degrees; Encourage ambulation with assistance for 50 feet within 6 hours of post-op  Goal: Pain at adequate level as identified by patient  Outcome: Progressing  Goal: Patient will maintain normal GI status  Outcome: Progressing  Flowsheets (Taken 09/24/2018 0018)  Patient will maintain normal GI status: Assess for nausea and/or vomiting. Provide pharmacological and/or non-pharmacological support as needed.; Assess for normal bowel sounds and flatus  Goal: Patient/patient care companion  demonstrates understanding of surgery/treatment plan, medication, and discharge plans  Outcome: Progressing     Problem: Safety  Goal: Patient will be free from injury during hospitalization  Outcome: Progressing     Problem: Psychosocial and Spiritual Needs  Goal: Demonstrates ability to cope with hospitalization/illness  Outcome: Progressing     Problem: Moderate/High Fall Risk Score >5  Goal: Patient will remain free of falls  Outcome: Progressing      Pt A&O X4. VSS. No SOB. Lying in bed.  surgical dressings had small drainage (after walking) reinforced with extra gauze.  Pt able to ambulate to hallway with aide of walker, accompanied by her twin..   Jp drain patent see flowchart for output.  Encouraged TCDB and IS use.   Continent of bowel and bladder.   Pain Controlled with oxycodone 2.5 mg (pt preference)  Good pedal pulses bilaterally,cap refill <3 sec in all 4 extremities.  Denies Nausea and vomiting.  Call bell and bedside table within reach at all times. Will continue to monitor patient  closely.       Most Recent Set of Vitals   Visit Vitals  BP 138/66   Pulse 89   Temp 99.7 F (37.6 C) (Oral)   Resp 18   Ht 1.626 m (5' 4.02")   Wt 66.6 kg (146 lb 13.2 oz)   LMP 01/19/1999   SpO2 96%   BMI 25.19 kg/m       Diagnosis:    ICD-10-CM    1. Degenerative lumbar spinal stenosis M48.061 Activity as tolerated     Weight restrictions     Driving Restrictions     Wound care           Procedure(s) with comments:  LAMINECTOMY, POSTERIOR LUMBAR, DECOMPRESSION, FUSION LEVEL 2 - L4-S1 LAMINECTOMY AND FUSION    3 Days Post-Op  -------------------     Patient Lines/Drains/Airways Status  Active Lines, Drains and Airways       Name:   Placement date:   Placement time:   Site:   Days:    Peripheral IV 09/21/18 Right Forearm   09/21/18    1131    Forearm   2    Peripheral IV 09/21/18 Left Forearm   09/21/18    1207    Forearm   2    Closed/Suction Drain Inferior;Right;Midline Back Bulb   09/21/18    --    Back   3                         Lambert Mody Timera Windt  09/24/18  12:19 AM

## 2018-09-28 ENCOUNTER — Encounter: Payer: Self-pay | Admitting: Orthopaedic Surgery

## 2018-09-29 NOTE — UM Notes (Signed)
UTILIZATION REVIEW CONTACT: Name: Alfonso Patten, RN, MSN, CCM, ACM  Clinical Case Manager  - Utilization Review  [facility]  Address:  [address] [place], [place]  [address]  NPI:   [id number]  Tax ID:  [id number]  Phone: [phone]  Fax: [phone]  Email: [email or web address]    Please use fax number [phone] to provide authorization for hospital services or to request additional information.        PATIENT NAME: Carolyn [name],Carolyn [name]   DOB: [birth year]     Continued stay Review 09/23/18  Identified Center Line barriers:Potential barriers to discharge:: Decreased mobility     OP notes:  Procedure Date: 09/21/2018  PREOPERATIVE DIAGNOSES:  1.  Lumbar spinal stenosis, L4-L5, L5-S1, with neurogenic claudication.  2.  Degenerative spondylolisthesis, L4-L5.  3.  Degenerative disk disease, L4-L5, L5-S1.  4.  Facet arthropathy L4-L5, L5-S1.     POSTOPERATIVE DIAGNOSES:  1.  Lumbar spinal stenosis, L4-L5, L5-S1, with neurogenic claudication.  2.  Degenerative spondylolisthesis, L4-L5.  3.  Degenerative disk disease, L4-L5, L5-S1.  4.  Facet arthropathy L4-L5, L5-S1.     TITLE OF PROCEDURES:  1.  Laminectomy L4, L5, and S1.  2.  Posterolateral fusion, L4 to S1.  3.  Spinal instrumentation, L4 to S1 [employer] system).  4.  Bone marrow aspiration, iliac crest.  5.  Bone grafting (local laminar fragments, cortical cancellous allograft,  bone marrow aspirate, and osteoconductive sponge).  6.  Administration of 2 g of Ancef.   ____________________________________________  CSR Date: 09/23/18, [facility] 8,   Procedure(s) with comments:  LAMINECTOMY, POSTERIOR LUMBAR, DECOMPRESSION, FUSION LEVEL 2 - L4-S1 LAMINECTOMY AND FUSION    2 Days Post-Op   -------------------   Patient reports few occurrences of nystagmus and "floating vision," D/C Ketamine   JP drain in place, Drain 220cc/24 hrs and needs to stay one more day and continue monitoring drainage.     VS:   100 F (37.8 C) Oral  79 98 % -- 18 119/57     IV meds: Ketamine gtt [place]'d   ___________________  09/24/18   JP [place] and patient was discharged to home     Primary Payor: OUT OF STATE BLUE CROSS / Plan: BCBS OUT OF STATE PPO / Product Type: BCBS /   NOTES TO REVIEWER:    This clinical review is based on/compiled from documentation provided by the treatment team within the patient's medical record.

## 2019-04-13 ENCOUNTER — Other Ambulatory Visit: Payer: Self-pay

## 2019-04-13 DIAGNOSIS — M961 Postlaminectomy syndrome, not elsewhere classified: Secondary | ICD-10-CM

## 2019-04-14 ENCOUNTER — Ambulatory Visit
Admission: RE | Admit: 2019-04-14 | Discharge: 2019-04-14 | Disposition: A | Discharge status: Home / Self Care | Payer: BC Managed Care – PPO | Source: Ambulatory Visit

## 2019-04-14 DIAGNOSIS — M47816 Spondylosis without myelopathy or radiculopathy, lumbar region: Secondary | ICD-10-CM | POA: Insufficient documentation

## 2019-04-14 DIAGNOSIS — M961 Postlaminectomy syndrome, not elsewhere classified: Secondary | ICD-10-CM

## 2019-04-14 MED ORDER — GADOBUTROL 1 MMOL/ML IV SOLN
6.50 mL | Freq: Once | INTRAVENOUS | Status: AC | PRN
Start: 2019-04-14 — End: 2019-04-14
  Administered 2019-04-14: 08:00:00 6.5 mmol via INTRAVENOUS
  Filled 2019-04-14: qty 7.5

## 2019-04-15 ENCOUNTER — Other Ambulatory Visit: Payer: Self-pay

## 2019-04-15 DIAGNOSIS — M961 Postlaminectomy syndrome, not elsewhere classified: Secondary | ICD-10-CM

## 2019-05-31 ENCOUNTER — Other Ambulatory Visit: Payer: Self-pay | Admitting: Orthopaedic Surgery

## 2019-07-06 ENCOUNTER — Emergency Department
Admission: EM | Admit: 2019-07-06 | Discharge: 2019-07-06 | Disposition: A | Discharge status: Home / Self Care | Payer: BC Managed Care – PPO | Attending: Emergency Medicine | Admitting: Emergency Medicine

## 2019-07-06 ENCOUNTER — Emergency Department: Payer: BC Managed Care – PPO

## 2019-07-06 DIAGNOSIS — E785 Hyperlipidemia, unspecified: Secondary | ICD-10-CM | POA: Insufficient documentation

## 2019-07-06 DIAGNOSIS — M545 Low back pain, unspecified: Secondary | ICD-10-CM

## 2019-07-06 DIAGNOSIS — R1013 Epigastric pain: Secondary | ICD-10-CM | POA: Insufficient documentation

## 2019-07-06 LAB — URINALYSIS, REFLEX TO MICROSCOPIC EXAM IF INDICATED
Bilirubin, UA: NEGATIVE
Blood, UA: NEGATIVE
Glucose, UA: NEGATIVE
Ketones UA: 5 — AB
Leukocyte Esterase, UA: NEGATIVE
Nitrite, UA: NEGATIVE
Protein, UR: 30 — AB
Specific Gravity UA: 1.029 (ref 1.001–1.035)
Urine pH: 5 (ref 5.0–8.0)
Urobilinogen, UA: NORMAL mg/dL (ref 0.2–2.0)

## 2019-07-06 LAB — COMPREHENSIVE METABOLIC PANEL
ALT: 14 U/L (ref 0–55)
AST (SGOT): 22 U/L (ref 5–34)
Albumin/Globulin Ratio: 1.5 (ref 0.9–2.2)
Albumin: 4.2 g/dL (ref 3.5–5.0)
Alkaline Phosphatase: 68 U/L (ref 37–106)
Anion Gap: 11 (ref 5.0–15.0)
BUN: 14 mg/dL (ref 7.0–19.0)
Bilirubin, Total: 0.6 mg/dL (ref 0.2–1.2)
CO2: 22 mEq/L (ref 22–29)
Calcium: 9.4 mg/dL (ref 7.9–10.2)
Chloride: 103 mEq/L (ref 100–111)
Creatinine: 1 mg/dL (ref 0.6–1.0)
Globulin: 2.8 g/dL (ref 2.0–3.6)
Glucose: 121 mg/dL — ABNORMAL HIGH (ref 70–100)
Potassium: 4.4 mEq/L (ref 3.5–5.1)
Protein, Total: 7 g/dL (ref 6.0–8.3)
Sodium: 136 mEq/L (ref 136–145)

## 2019-07-06 LAB — PT AND APTT
PT INR: 1.1 (ref 0.9–1.1)
PT: 14.3 s (ref 12.6–15.0)
PTT: 28 s (ref 23–37)

## 2019-07-06 LAB — ECG 12-LEAD
Atrial Rate: 80 {beats}/min
P Axis: 82 degrees
P-R Interval: 168 ms
Q-T Interval: 366 ms
QRS Duration: 80 ms
QTC Calculation (Bezet): 422 ms
R Axis: 70 degrees
T Axis: 64 degrees
Ventricular Rate: 80 {beats}/min

## 2019-07-06 LAB — CBC AND DIFFERENTIAL
Absolute NRBC: 0 10*3/uL (ref 0.00–0.00)
Basophils Absolute Automated: 0.03 10*3/uL (ref 0.00–0.08)
Basophils Automated: 0.4 %
Eosinophils Absolute Automated: 0.07 10*3/uL (ref 0.00–0.44)
Eosinophils Automated: 0.9 %
Hematocrit: 40.8 % (ref 34.7–43.7)
Hgb: 13.1 g/dL (ref 11.4–14.8)
Immature Granulocytes Absolute: 0.04 10*3/uL (ref 0.00–0.07)
Immature Granulocytes: 0.5 %
Lymphocytes Absolute Automated: 1.21 10*3/uL (ref 0.42–3.22)
Lymphocytes Automated: 15.5 %
MCH: 30.3 pg (ref 25.1–33.5)
MCHC: 32.1 g/dL (ref 31.5–35.8)
MCV: 94.4 fL (ref 78.0–96.0)
MPV: 12 fL (ref 8.9–12.5)
Monocytes Absolute Automated: 0.51 10*3/uL (ref 0.21–0.85)
Monocytes: 6.5 %
Neutrophils Absolute: 5.94 10*3/uL (ref 1.10–6.33)
Neutrophils: 76.2 %
Nucleated RBC: 0 /100 WBC (ref 0.0–0.0)
Platelets: 226 10*3/uL (ref 142–346)
RBC: 4.32 10*6/uL (ref 3.90–5.10)
RDW: 13 % (ref 11–15)
WBC: 7.8 10*3/uL (ref 3.10–9.50)

## 2019-07-06 LAB — LIPASE: Lipase: 25 U/L (ref 8–78)

## 2019-07-06 LAB — GFR: EGFR: 53.8

## 2019-07-06 MED ORDER — ACETAMINOPHEN-CODEINE #3 300-30 MG PO TABS
1.00 | ORAL_TABLET | Freq: Four times a day (QID) | ORAL | 0 refills | Status: AC | PRN
Start: 2019-07-06 — End: 2019-07-13

## 2019-07-06 MED ORDER — ONDANSETRON HCL 4 MG/2ML IJ SOLN
4.00 mg | Freq: Once | INTRAMUSCULAR | Status: AC
Start: 2019-07-06 — End: 2019-07-06
  Administered 2019-07-06: 14:00:00 4 mg via INTRAVENOUS
  Filled 2019-07-06: qty 2

## 2019-07-06 MED ORDER — FAMOTIDINE 10 MG/ML IV SOLN (WRAP)
20.00 mg | Freq: Once | INTRAVENOUS | Status: AC
Start: 2019-07-06 — End: 2019-07-06
  Administered 2019-07-06: 18:00:00 20 mg via INTRAVENOUS
  Filled 2019-07-06: qty 2

## 2019-07-06 MED ORDER — OMEPRAZOLE MAGNESIUM 20 MG PO TBEC
20.00 mg | DELAYED_RELEASE_TABLET | Freq: Every day | ORAL | 0 refills | Status: AC
Start: 2019-07-06 — End: 2019-07-20

## 2019-07-06 MED ORDER — ACETAMINOPHEN 325 MG PO TABS
650.0000 mg | ORAL_TABLET | Freq: Once | ORAL | Status: AC
Start: 2019-07-06 — End: 2019-07-06
  Administered 2019-07-06: 15:00:00 650 mg via ORAL
  Filled 2019-07-06: qty 2

## 2019-07-06 MED ORDER — IOHEXOL 350 MG/ML IV SOLN
100.00 mL | Freq: Once | INTRAVENOUS | Status: AC | PRN
Start: 2019-07-06 — End: 2019-07-06
  Administered 2019-07-06: 16:00:00 100 mL via INTRAVENOUS

## 2019-07-06 NOTE — ED Triage Notes (Signed)
Patient states 1 week history of epigastric pain radiating through to her back. Patient denies diaphoresisor vomiting. States occasional nausea. Pain achy in nature. Patient has had pain before but usually goes away.

## 2019-07-06 NOTE — ED Provider Notes (Signed)
I have briefly evaluated this patient as triage physician in order to facilitate and initiate the ordering of laboratory and imaging studies as needed.  Initial evaluation done to expedite care - not definitive treatment.     Tyton Abdallah, MD  07/06/19 1959

## 2019-07-06 NOTE — ED Notes (Signed)
Verbal and written discharge instructions given to the patient who verbalized understanding and left ambulatory with steady gait to front of ED to await ride.

## 2019-07-06 NOTE — Discharge Instructions (Signed)
The Cat scan showed no definite cause for your upper epigastric pain   It shows some gallstones but no inflammation of your gallbladder    It does show some thickening of your uterus but this is not what is causing the pain  You do need to see your ob gyn who will likely want to do an ultrasound to evaluate this    You will also need to see a GI doctor for likely endoscopy, to rule out an ulcer  I recommend you start prilosec and take for the next 2 weeks   Ulcers can cause pain that is perceived in the back but you should probably also see dr childs in follow up    I have prescribed tylenol with codeine this is a narcotic, take it only for severe pain and do NOT drive or drink alcohol while taking it, it will make you sleepy so best to take it at bedtime    If you have worsening pain please return to the ER

## 2019-07-06 NOTE — ED Provider Notes (Signed)
[place] [facility]   ATTENDING HISTORY AND PHYSICAL        Visit date: 07/06/2019      CLINICAL SUMMARY          Diagnosis:    .     Final diagnoses:   Epigastric pain   Acute midline low back pain without sciatica         Medical Decision Making / Clinical Summary:      Carolyn [name] is a 78 y.o. female with several weeks of epigastric pain that is associated with some back pain, pt cannot say whether the pain radiates to the the back or she has separate back pain, did have recent lumbar laminectomy by dr childs   associated with some nausea no vomiting  No prior hx of abdominal surgery  No chest pain or sob  Not constipated no diarrhea or fever  No dysuria or frequency  No hematuria  Does drink etoh one drink a Carolyn [name], none in last 3 days  Saw pcp yesterday who was going to do an outpt ct but pain got worse so pt came here today     Here looked well comfortable  Ct mostly unremarakable with exception of some gallstones(pt knows she has a hx of such) and some thickening of endometrial lining(no vag bleeding and will follow up with her gyn)  Discussed with pt that she will likely need an endoscopy as we discussed ulcer as cause, home on prilosec to see her pcp  Pt asking for pain meds for back pain, given small number of tylenol #3            Disposition:         Discharge         Discharge Prescriptions     Medication Sig Dispense Auth. Provider    omeprazole (PriLOSEC OTC) 20 MG tablet Take 1 tablet (20 mg total) by mouth daily for 14 days 14 tablet Atom Solivan, Jani Files, MD    acetaminophen-codeine (TYLENOL #3) 300-30 MG per tablet Take 1 tablet by mouth every 6 (six) hours as needed for Pain (or cough) 6 tablet Aleeza [place], Jani Files, MD                      CLINICAL INFORMATION        HPI:      Chief Complaint: Epigastric pain and Back Pain  .    Carolyn [name] is a 78 y.o. female who presents with several weeks of epigastric pain that radiates to her back associated with some nausea no vomiting  No prior hx of  abdominal surgery  No chest pain or sob  Not constipated no diarrhea or fever  No dysuria or frequency  No hematuria  Does drink etoh one drink a Carolyn [name], none in last 3 days  Saw pcp yesterday who was going to do an outpt ct but pain got worse so pt came here today     History obtained from: patient      ROS:      Positive and negative ROS elements as per HPI.  All other systems reviewed and negative.      Physical Exam:      Pulse 76   BP 141/72   Resp 17   SpO2 99 %   Temp 98.4 F (36.9 C)    Physical Exam  Vitals signs and nursing note reviewed.   Constitutional:       General: She is  not in acute distress.     Appearance: Normal appearance. She is normal weight. She is not ill-appearing, toxic-appearing or diaphoretic.   HENT:      Head: Normocephalic and atraumatic.      Right Ear: External ear normal.      Left Ear: External ear normal.   Eyes:      General: No scleral icterus.        Right eye: No discharge.         Left eye: No discharge.   Neck:      Musculoskeletal: Normal range of motion and neck supple.   Cardiovascular:      Rate and Rhythm: Normal rate and regular rhythm.      Pulses: Normal pulses.   Pulmonary:      Effort: Pulmonary effort is normal. No respiratory distress.      Breath sounds: Normal breath sounds. No wheezing or rales.   Abdominal:      General: Bowel sounds are normal. There is no distension.      Palpations: Abdomen is soft. There is no mass.      Tenderness: There is abdominal tenderness. There is no right CVA tenderness, left CVA tenderness or guarding.      Hernia: No hernia is present.      Comments: Nl bs soft with epigastric tenderness  No lower abdominal tenderness  Not tender over [place] point  No murphy sign    Musculoskeletal: Normal range of motion.         General: No swelling or tenderness.   Skin:     General: Skin is warm and dry.      Capillary Refill: Capillary refill takes less than 2 seconds.   Neurological:      General: No focal deficit present.      Mental  Status: She is alert and oriented to person, place, and time.   Psychiatric:         Mood and Affect: Mood normal.         Behavior: Behavior normal.         Thought Content: Thought content normal.         Judgment: Judgment normal.               PAST HISTORY        Primary Care Provider: Danford Bad, MD        PMH/PSH:    .     Past Medical History:   Diagnosis Date    Abnormal Pap smear     >20 years ago, colpo, NL since    Dry eyes     drops daily    Hyperlipidemia     controlled w/ medication    Low back pain     Osteoarthritis     back, knees, hands    Pelvic mass in female 07/05/2014    Pelvic MRI 2015 Stable exam. 2. Simple cyst in the left mesorectal fat. 3. Proteinaceous cyst in the lower vagina representing a cyst of either Skenes duct or Bartholins gland duct. 4. Submucosal fibroid.        She has a past surgical history that includes Breast surgery (01/04/09); Colposcopy; Dilation and curettage of uterus (10/2013); Colonoscopy (2014); Bunionectomy (Left, 06/2016); Joint replacement (Right, 06/2017); and LAMINECTOMY, POSTERIOR LUMBAR, DECOMPRESSION, FUSION LEVEL 2 (N/A, 09/21/2018).      Social/Family History:            Listed Medications on Arrival:    .  Home Medications             acetaminophen (TYLENOL) 500 MG tablet     Take 2 tablets by mouth as needed     BIOTIN PO     Take 1,000 mg by mouth daily        cyclobenzaprine (FLEXERIL) 5 MG tablet     Take 1 tablet (5 mg total) by mouth 3 (three) times daily as needed for Muscle spasms     cycloSPORINE (RESTASIS) 0.05 % ophthalmic emulsion     Place 1 drop into both eyes 2 (two) times daily     Estradiol-Norethindrone Acet 0.5-0.1 MG per tablet     Take by mouth daily     famotidine (PEPCID) 20 MG tablet     Take 20 mg by mouth 2 (two) times daily     gabapentin (NEURONTIN) 300 MG capsule     Take 1 capsule (300 mg total) by mouth every 8 (eight) hours     simvastatin (ZOCOR) 20 MG tablet     Take 20 mg by mouth daily            Allergies:  She has No Known Allergies.            VISIT INFORMATION        Clinical Course in the ED:                         Medications Given in the ED:    .     ED Medication Orders (From admission, onward)    Start Ordered     Status Ordering Provider    07/06/19 1735 07/06/19 1734  famotidine (PEPCID) injection 20 mg  Once     Route: Intravenous  Ordered Dose: 20 mg     Last MAR action: Given Donald Memoli L    07/06/19 1551 07/06/19 1551  iohexol (OMNIPAQUE) 350 MG/ML injection 100 mL  IMG once as needed     Route: Intravenous  Ordered Dose: 100 mL     Last MAR action: Imaging Agent Given Savaya Hakes L    07/06/19 1425 07/06/19 1424  acetaminophen (TYLENOL) tablet 650 mg  Once     Route: Oral  Ordered Dose: 650 mg     Last MAR action: Given Andre Swander L    07/06/19 1347 07/06/19 1346  ondansetron (ZOFRAN) injection 4 mg  Once     Route: Intravenous  Ordered Dose: 4 mg     Last MAR action: Given Talon Regala L            Procedures:            Interpretations:      EKG -                     interpreted by me: normal sinus at rate of 80 no acute st t changes normal axis    Pulse ox interpretation 100%RA normal no intervention needed             RESULTS        Lab Results:      Results     Procedure Component Value Units Date/Time    Comprehensive metabolic panel [[id number]]  (Abnormal) Collected: 07/06/19 1341    Specimen: Blood Updated: 07/06/19 1523     Glucose 121 mg/dL      BUN 40.9 mg/dL      Creatinine 1.0 mg/dL  Sodium 136 mEq/L      Potassium 4.4 mEq/L      Chloride 103 mEq/L      CO2 22 mEq/L      Calcium 9.4 mg/dL      Protein, Total 7.0 g/dL      Albumin 4.2 g/dL      AST (SGOT) 22 U/L      ALT 14 U/L      Alkaline Phosphatase 68 U/L      Bilirubin, Total 0.6 mg/dL      Globulin 2.8 g/dL      Albumin/Globulin Ratio 1.5     Anion Gap 11.0    Lipase [[id number]] Collected: 07/06/19 1341    Specimen: Blood Updated: 07/06/19 1523     Lipase 25 U/L     GFR [[id number]] Collected: 07/06/19 1341     Updated: 07/06/19  1523     EGFR 53.8    PT/APTT [[id number]] Collected: 07/06/19 1341     Updated: 07/06/19 1515     PT 14.3 sec      PT INR 1.1     PTT 28 sec     CBC and differential [[id number]] Collected: 07/06/19 1341    Specimen: Blood Updated: 07/06/19 1448     WBC 7.80 x10 3/uL      Hgb 13.1 g/dL      Hematocrit 66.4 %      Platelets 226 x10 3/uL      RBC 4.32 x10 6/uL      MCV 94.4 fL      MCH 30.3 pg      MCHC 32.1 g/dL      RDW 13 %      MPV 12.0 fL      Neutrophils 76.2 %      Lymphocytes Automated 15.5 %      Monocytes 6.5 %      Eosinophils Automated 0.9 %      Basophils Automated 0.4 %      Immature Granulocytes 0.5 %      Nucleated RBC 0.0 /100 WBC      Neutrophils Absolute 5.94 x10 3/uL      Lymphocytes Absolute Automated 1.21 x10 3/uL      Monocytes Absolute Automated 0.51 x10 3/uL      Eosinophils Absolute Automated 0.07 x10 3/uL      Basophils Absolute Automated 0.03 x10 3/uL      Immature Granulocytes Absolute 0.04 x10 3/uL      Absolute NRBC 0.00 x10 3/uL     UA with Micro [[id number]]  (Abnormal) Collected: 07/06/19 1341    Specimen: Urine Updated: 07/06/19 1417     Urine Type Clean Catch     Color, UA Yellow     Clarity, UA Clear     Specific Gravity UA 1.029     Urine pH 5.0     Leukocyte Esterase, UA Negative     Nitrite, UA Negative     Protein, UR 30     Glucose, UA Negative     Ketones UA 5     Urobilinogen, UA Normal mg/dL      Bilirubin, UA Negative     Blood, UA Negative     RBC, UA 0 - 2 /hpf      WBC, UA 0 - 5 /hpf      Squamous Epithelial Cells, Urine 11 - 25 /hpf      Hyaline Casts, UA 0 - 2 /lpf  Radiology Results:      CT Abd/ Pelvis with IV and PO Contrast   Final Result      1. Abnormal configuration of the endometrial cavity with suspected   endometrial thickening. Correlation with pelvic ultrasound is   recommended.   2. Otherwise, no acute inflammatory process within the abdomen or   pelvis.      Launa Flight, MD    07/06/2019 3:58 PM                  Scribe Attestation:      I was  acting as a Neurosurgeon for Kahliyah Dick, Jani Files, MD on Martin,Zasha N  Treatment Team: Scribe: Margo Common     I am the first provider for this patient and I personally performed the services documented. Treatment Team: Scribe: Margo Common is scribing for me on Luba,Arbadella N. This note and the patient instructions accurately reflect work and decisions made by me.  Maylyn Narvaiz, Jani Files, MD                              Khrista Braun, Jani Files, MD  07/07/19 [id number]

## 2019-07-12 ENCOUNTER — Ambulatory Visit (FREE_STANDING_LABORATORY_FACILITY): Payer: BC Managed Care – PPO

## 2019-07-12 DIAGNOSIS — Z01818 Encounter for other preprocedural examination: Secondary | ICD-10-CM

## 2019-07-12 DIAGNOSIS — Z1159 Encounter for screening for other viral diseases: Secondary | ICD-10-CM

## 2019-07-13 LAB — COVID-19 (SARS-COV-2): SARS CoV 2 Overall Result: NEGATIVE

## 2019-07-18 ENCOUNTER — Other Ambulatory Visit: Payer: Self-pay | Admitting: Obstetrics & Gynecology

## 2019-08-01 ENCOUNTER — Other Ambulatory Visit: Payer: Self-pay | Admitting: Obstetrics & Gynecology

## 2020-03-30 ENCOUNTER — Other Ambulatory Visit: Payer: Self-pay

## 2020-08-28 ENCOUNTER — Other Ambulatory Visit: Payer: Self-pay | Admitting: Obstetrics & Gynecology

## 2021-06-07 ENCOUNTER — Ambulatory Visit
Admission: RE | Admit: 2021-06-07 | Payer: BC Managed Care – PPO | Source: Ambulatory Visit | Admitting: Gastroenterology

## 2021-06-07 ENCOUNTER — Encounter: Admission: RE | Payer: Self-pay | Source: Ambulatory Visit

## 2021-06-07 SURGERY — DONT USE, USE 1094-COLONOSCOPY, DIAGNOSTIC (SCREENING)
Anesthesia: Anesthesia MAC / Sedation | Site: Abdomen

## 2021-08-29 ENCOUNTER — Other Ambulatory Visit (INDEPENDENT_AMBULATORY_CARE_PROVIDER_SITE_OTHER): Payer: Self-pay | Admitting: Internal Medicine

## 2021-09-09 ENCOUNTER — Other Ambulatory Visit (INDEPENDENT_AMBULATORY_CARE_PROVIDER_SITE_OTHER): Payer: Self-pay | Admitting: Internal Medicine

## 2021-11-27 IMAGING — MR MRI CERVICAL SPINE WITHOUT CONTRAST
4 of 6 series · 23 of 48 positions shown · IV contrast (gadolinium)
Comparison: None prior.

________________________________________________________________________________________________ 
MRI CERVICAL SPINE WITHOUT CONTRAST, 11/27/2021 [DATE]: 
CLINICAL INDICATION: Neck pain. Sharp headache with flexion and extension 
lasting only 2 minutes intermittently for 2 to 3 months. Bilateral hand 
numbness, right worse than left side.
TECHNIQUE: Multiplanar, multiecho position MR images of the cervical spine were 
performed without intravenous gadolinium enhancement. Patient was scanned on a 
1.5T magnet.

[Series 101: survey* · axial · 10.0mm · 1.56mm/px · z∈[-30,+199]mm · 7 of 15 slices shown]
[im 1/15]
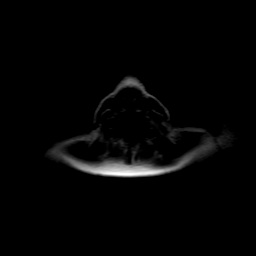
[im 3/15]
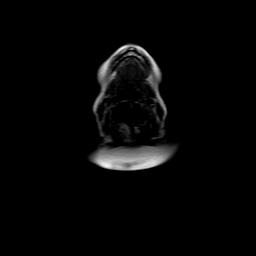
[im 5/15]
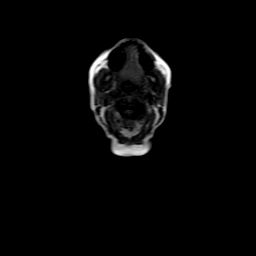
[im 8/15]
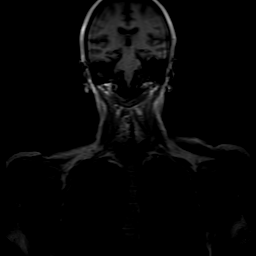
[im 10/15]
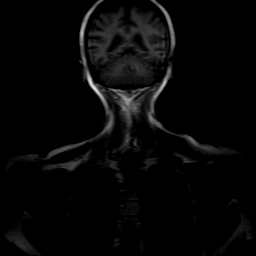
[im 12/15]
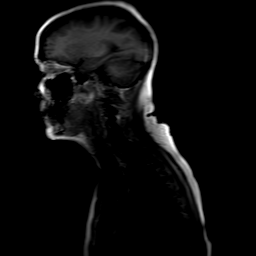
[im 15/15]
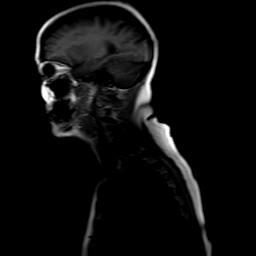

[Series 201: t2w_cor-surv · coronal · 5.0mm · 0.85mm/px · 3 of 7 slices shown]
[im 1/7]
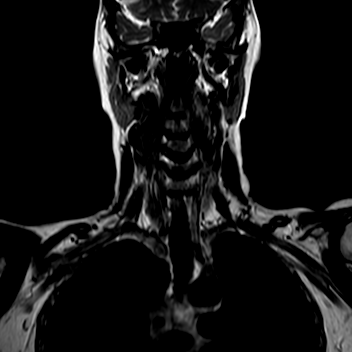
[im 4/7]
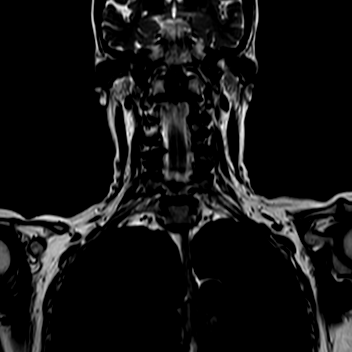
[im 7/7]
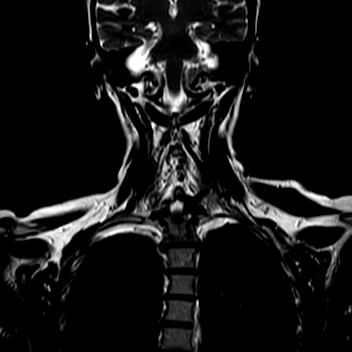

[Series 301: t1_sag · sagittal · 3.0mm · 0.32mm/px · 7 of 16 slices shown]
[im 1/16]
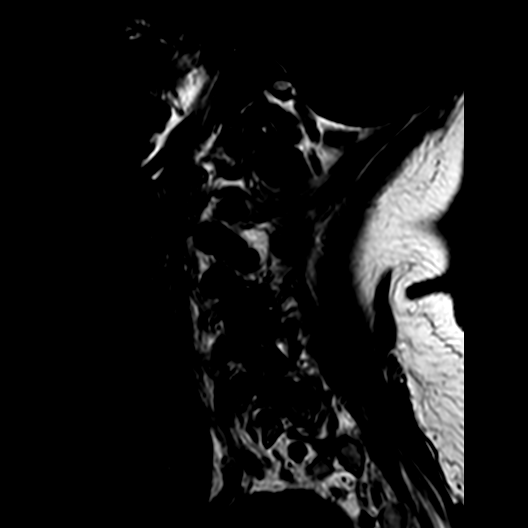
[im 3/16]
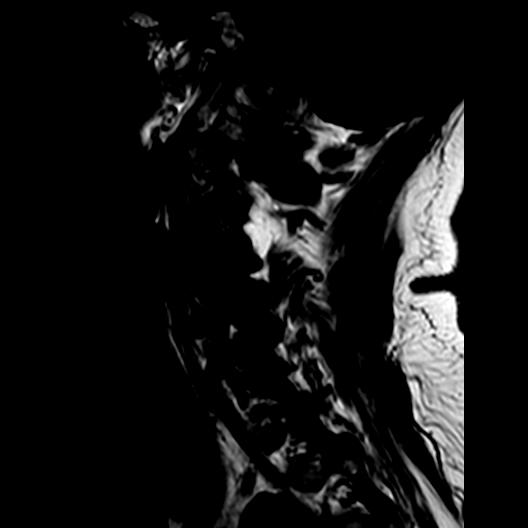
[im 6/16]
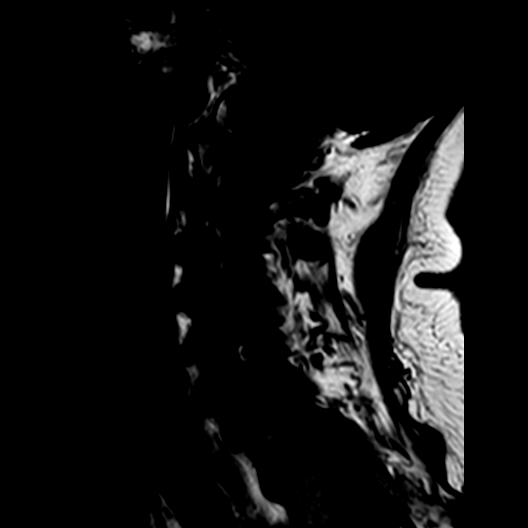
[im 8/16]
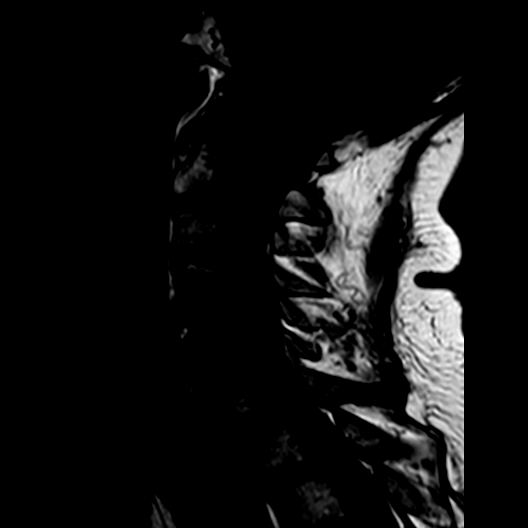
[im 11/16]
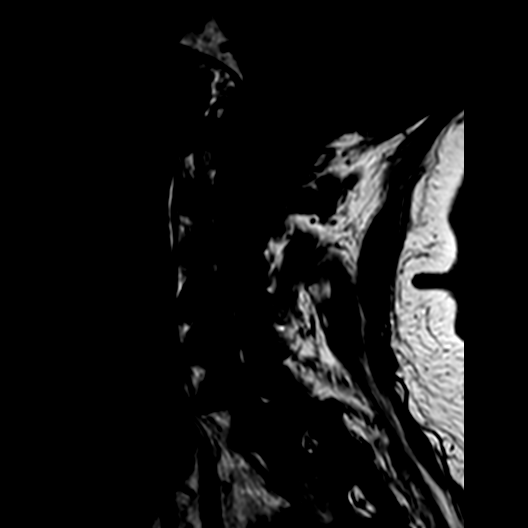
[im 13/16]
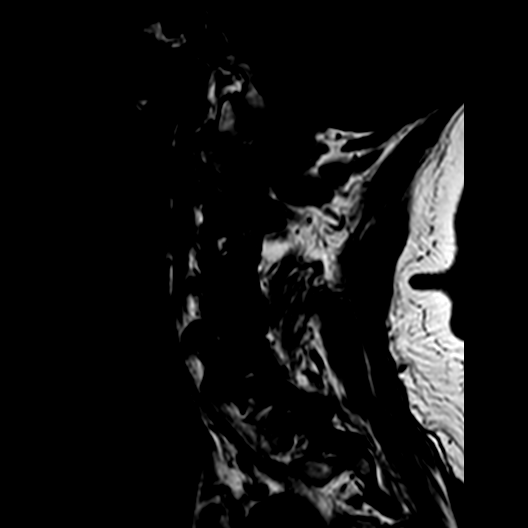
[im 16/16]
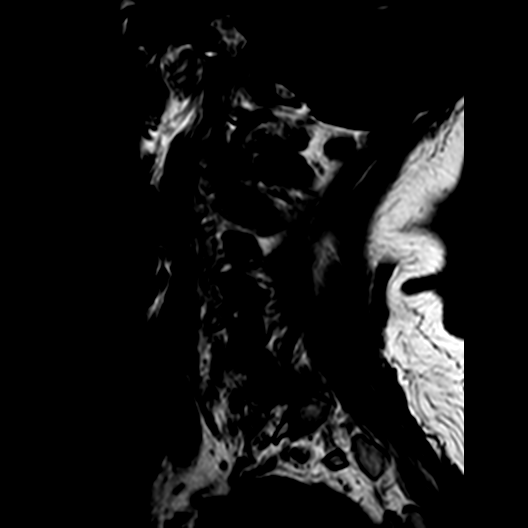

[Series 401: t2w_mv_xd_sag · sagittal · 3.0mm · 0.29mm/px · 6 of 16 slices shown]
[im 1/16]
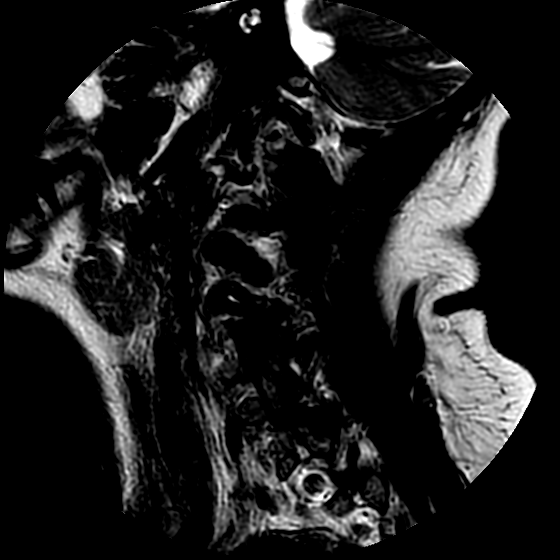
[im 3/16]
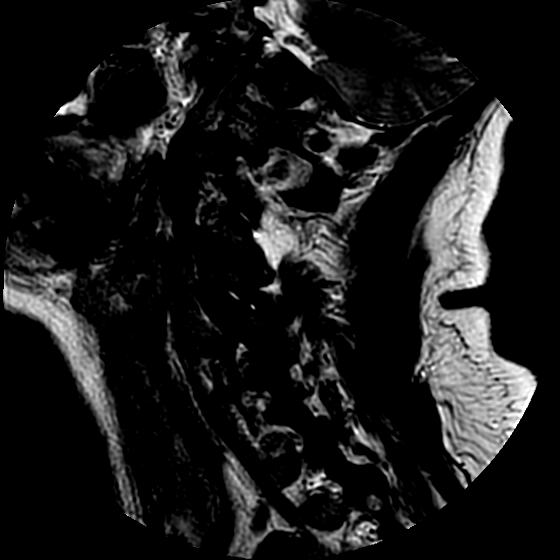
[im 6/16]
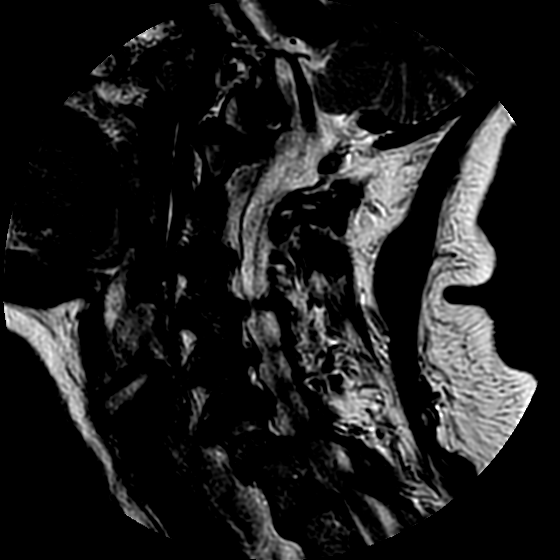
[im 8/16]
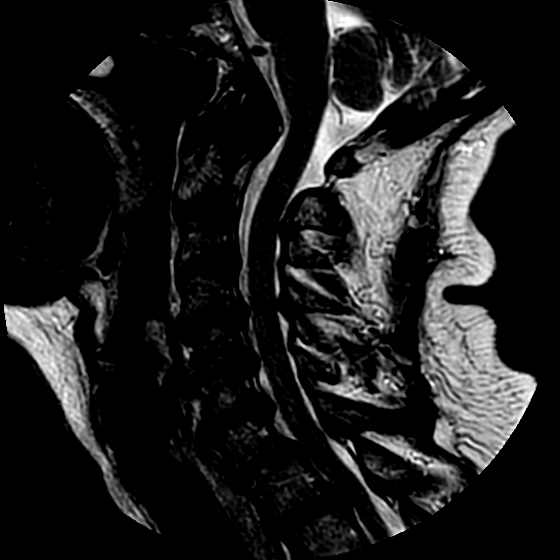
[im 11/16]
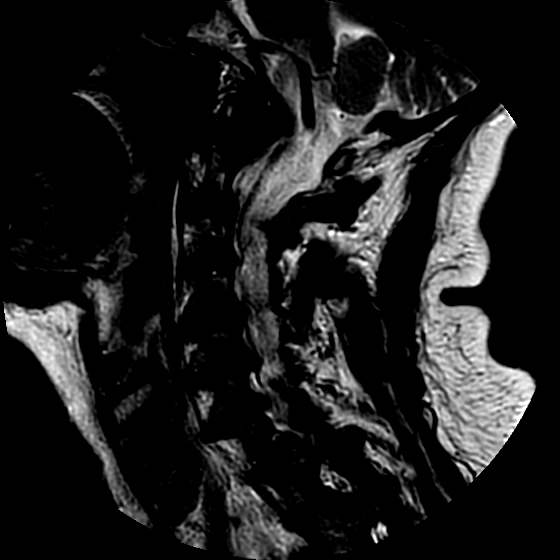
[im 13/16]
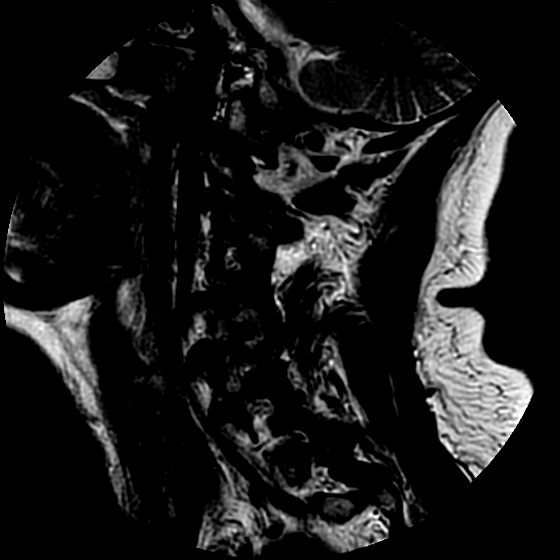

[23 of 48 positions shown; findings below may reference images not displayed]

FINDINGS: There is mild anterolisthesis C7 on T1 and T1 on T2. Scattered 
interbody osteophytic spurring. No acute vertebral body fracture. There are 
degenerative changes of the anterior C1-C2 articulation. No signal abnormality 
or mass within the included portion of the spinal cord or spinal canal. The 
cerebellar tonsils are well located. The foramen magnum is negative. Included 
portions of the intracranial contents are negative. Posterior paraspinal soft 
tissues are negative. 
C2-C3: Moderate disc desiccation mild disc height loss. There is uncovertebral 
and facet hypertrophy greater on the left with severe left neural foraminal 
stenosis with potential for left C3 neural impingement. 
C3-C4: Moderate disc desiccation and disc height loss. There is uncovertebral 
and facet hypertrophy greater on the right with severe right neural foraminal 
stenosis with potential for right C4 neural impingement. 
C4-C5: Moderate disc desiccation and disc height loss. Mild dorsal disc 
osteophyte ridging. There is uncovertebral and facet hypertrophy greater on the 
right with severe right neural foraminal stenosis with potential for right C5 
neural impingement. Mild spinal canal stenosis with the thecal sac narrowed to 7 
8 mm AP at the level the mid sagittal plane. 
C5-C6: Diffuse disc desiccation and disc height loss with near complete 
obliteration of the disc space. Mild dorsal osteophytic ridging. There is 
uncovertebral and facet hypertrophy with severe bilateral neural foraminal 
stenoses with potential for neural impingement. Mild spinal canal stenosis with 
the thecal sac narrowed to 8 mm AP. 
C6-C7: Moderate disc desiccation and disc height loss. Mild dorsal disc 
osteophyte complex eccentric to the right with partial effacement of ventral 
thecal sac. There is uncovertebral and facet hypertrophy with mild to moderate 
left neural foraminal stenosis. Mild spinal canal stenosis with the thecal sac 
narrowed to 8 mm AP. 
C7-T1: Diffuse disc desiccation and disc height loss. Mild anterolisthesis with 
partial unroofing of the disc. Mild spinal canal stenosis with the thecal sac 
narrowed to 8 mm AP. Mild bilateral neural foraminal stenoses. 
T1-T2: Moderate disc desiccation and disc height loss. Mild anterolisthesis with 
partial unroofing of the disc. Mild facet hypertrophy. Mild dorsal disc 
osteophyte complex with abutment of the ventral cord and mild cord deformation. 
Mild spinal canal stenosis with the thecal sac narrowed to 8 mm AP. Mild 
bilateral neural foraminal stenoses.
IMPRESSION: 1.  Advanced spondylotic changes cervical spine. 
2.  Multilevel spinal canal stenoses and advanced neural foraminal stenoses with 
potential for multilevel neural impingement. 
3.  Mild degenerative spondylolisthesis C7-T1 and T1-T2 with mild ventral cord 
deformation and T1-T2.

## 2021-12-09 IMAGING — MR MRI THORACIC SPINE WITHOUT CONTRAST
4 of 8 series · 16 of 48 positions shown · IV contrast (gadolinium)
Comparison: None.

________________________________________________________________________________________________ 
MRI THORACIC SPINE WITHOUT CONTRAST, 12/09/2021 [DATE]: 
CLINICAL INDICATION: Spondylosis, thoracic.
TECHNIQUE: Multiplanar, multiecho position MR images of the thoracic spine were 
performed without intravenous gadolinium enhancement. Patient was scanned on a 
1.5T magnet.

[Series 101: survey · axial · 10.0mm · 1.67mm/px · z∈[-30,+199]mm · 5 of 15 slices shown]
[im 1/15]
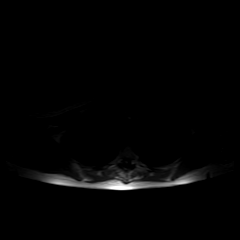
[im 4/15]
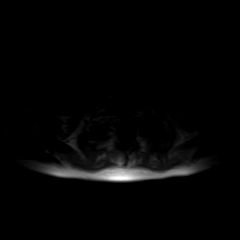
[im 8/15]
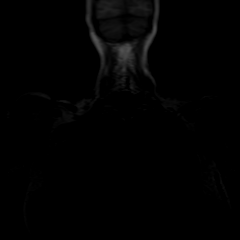
[im 11/15]
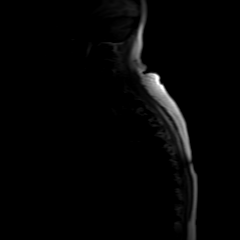
[im 15/15]
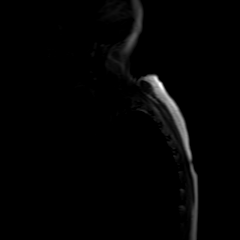

[Series 202: mobiview cor mobi · coronal · 10.0mm · 0.88mm/px · 4 of 14 slices shown]
[im 1/14]
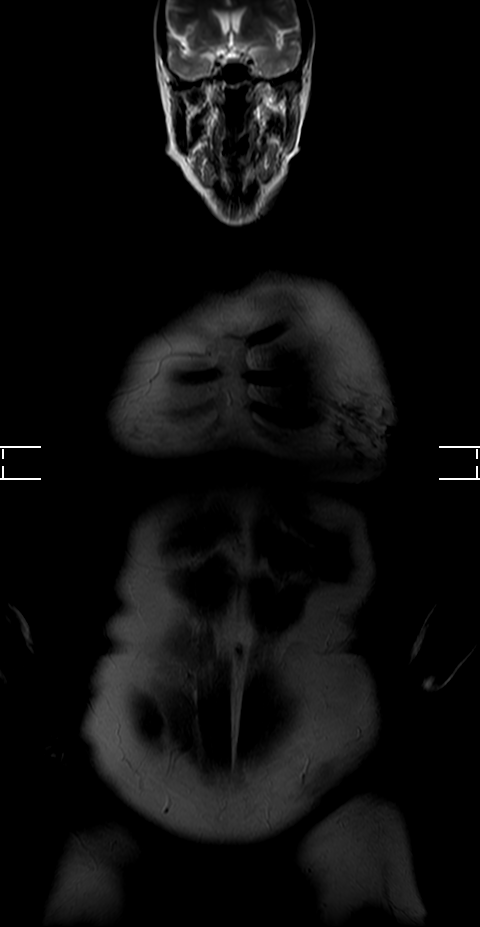
[im 5/14]
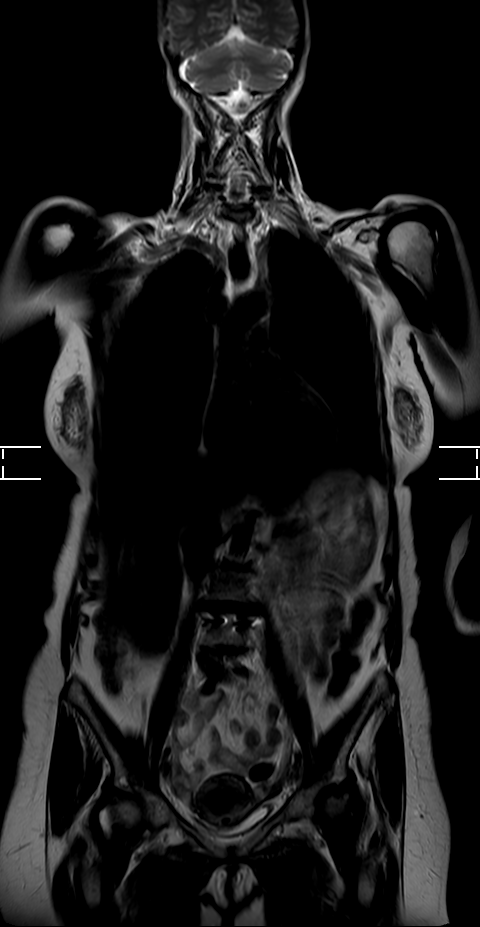
[im 9/14]
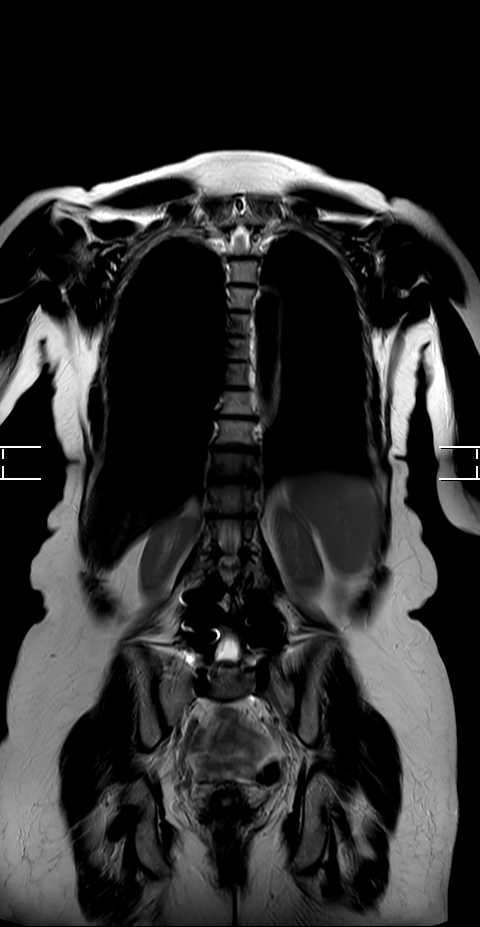
[im 14/14]
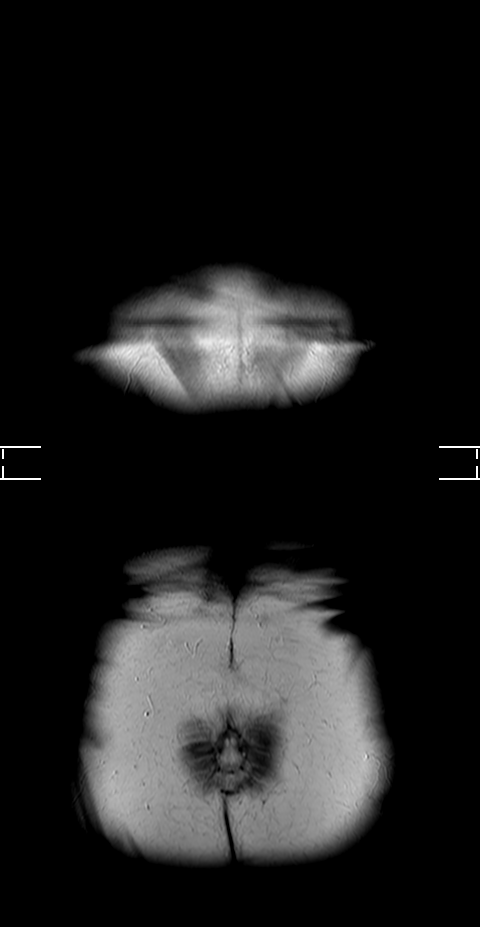

[Series 303: T1 · sagittal · 5.5mm · 0.66mm/px · 4 of 15 slices shown]
[im 1/15]
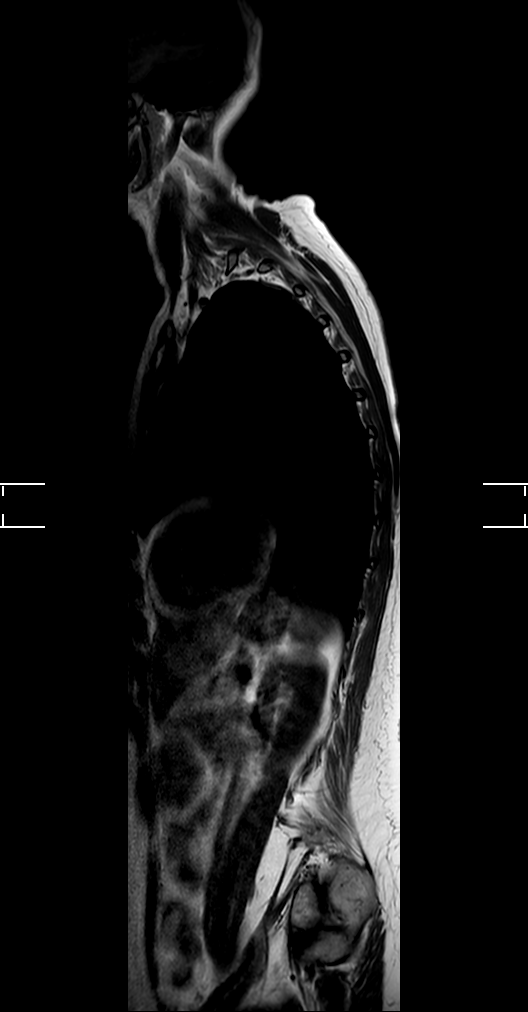
[im 5/15]
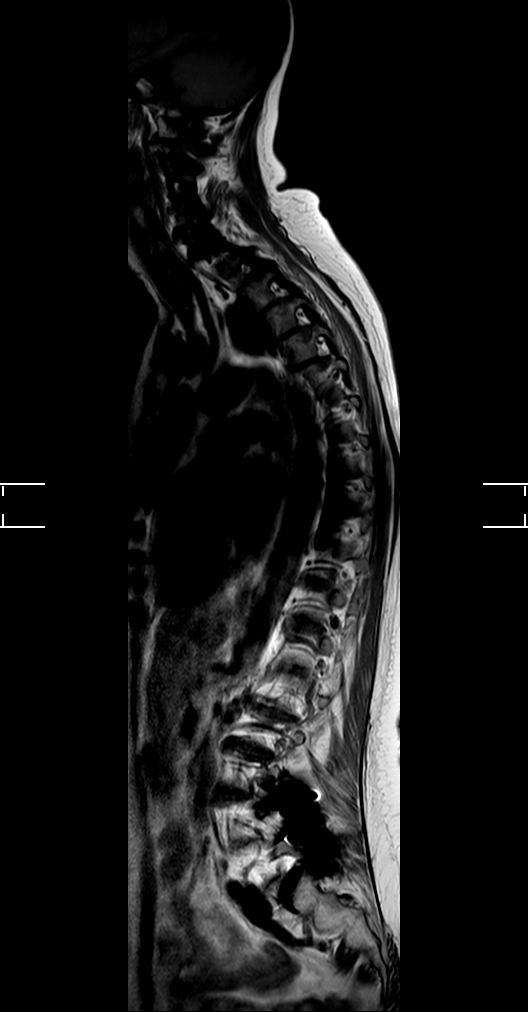
[im 10/15]
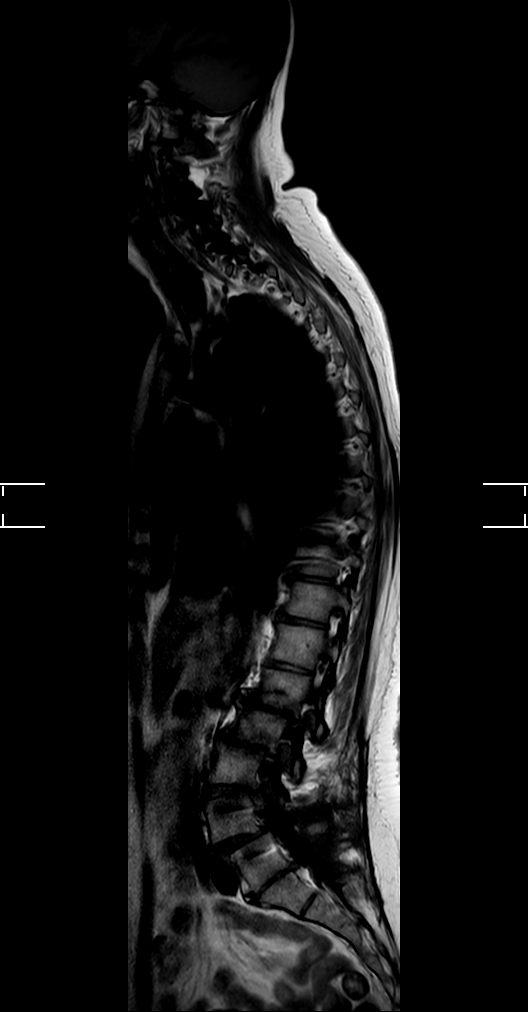
[im 15/15]
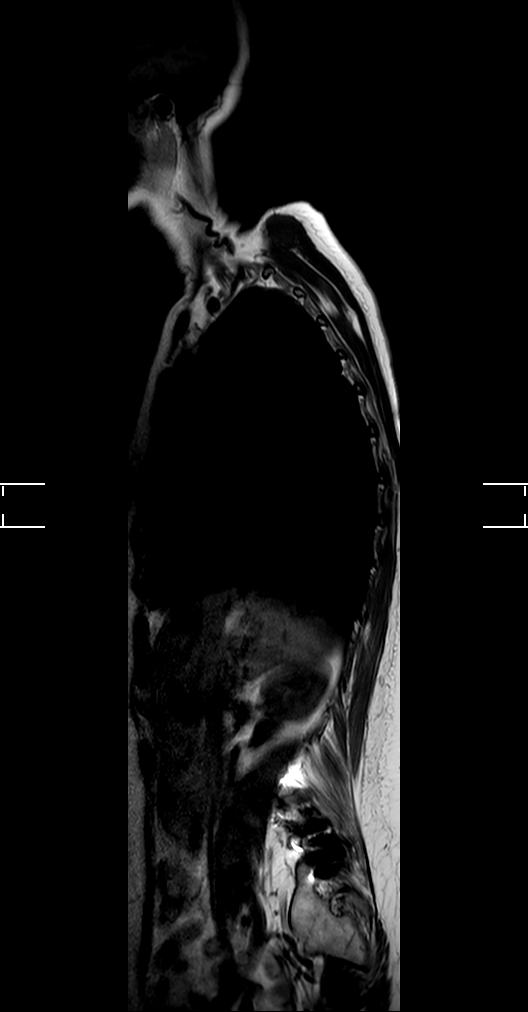

[Series 401: t1_tse_sag · sagittal · 3.0mm · 0.57mm/px · 3 of 21 slices shown]
[im 1/21]
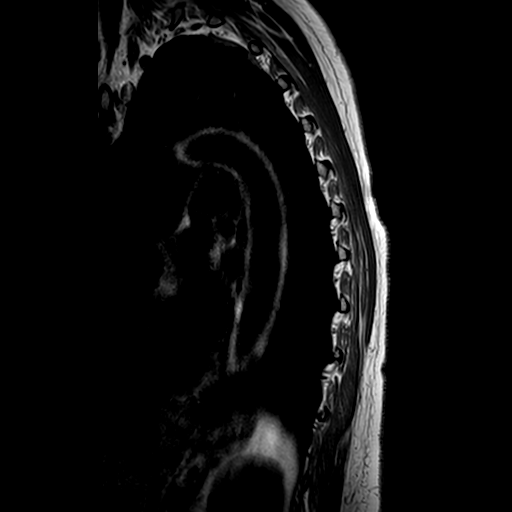
[im 11/21]
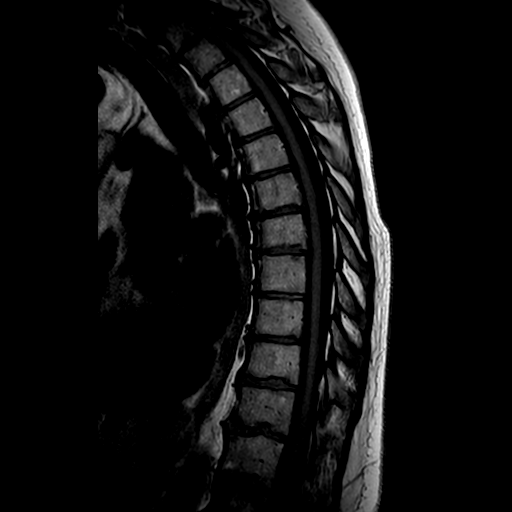
[im 21/21]
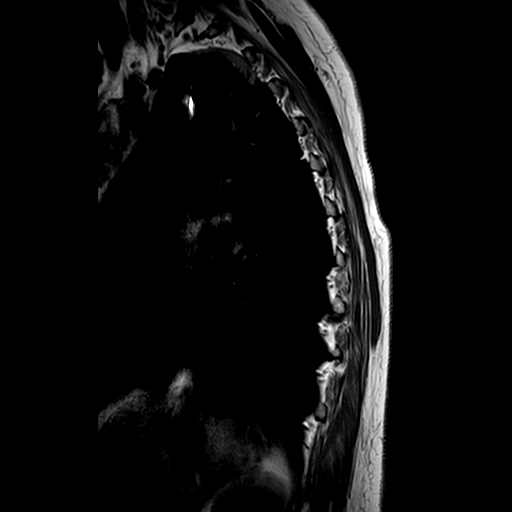

[16 of 48 positions shown; findings below may reference images not displayed]

FINDINGS: VERTEBRA: No fractures or marrow replacing lesions. Multifocal degenerative 
osteophytes and several Schmorl nodes. 4 mm anterolisthesis at C7-T1, 3 mm 
anterolisthesis at T1-2 and 2 mm anterior positioning of T2 on T3. C7-T1 and 
T1-2 mild central canal stenosis (thecal sac measures 8 mm in AP dimensions) and 
mild neural foraminal stenosis. 
DISCS: Multilevel degenerative disc disease. Small T1-2 and tiny T2-3 through 
T6-7 central disc protrusions with mild flattening of the ventral cord. Left 
paracentral disc protrusion at T7-8 with mild flattening of the left ventral 
cord. Multilevel disc bulges. No evidence of discitis. Multifocal perineural 
cysts. 
SPINAL CORD: Mild flattening of the ventral cord at the level of the disc 
protrusions at T1-2 through T7-8. Spinal cord is otherwise normal in caliber and 
signal intensity. Conus medullaris is at the level of L1.  
OTHER: Localizer views demonstrate multilevel degenerative change of the 
cervical spine: Please refer to 11/27/2021 MRI cervical spine MRI. Postsurgical 
and degenerative change of the lumbar spine with multilevel listheses.
IMPRESSION: 1.  Multifocal degenerative change, 4 mm anterolisthesis at C7-T1 and 3 mm 
anterolisthesis at T1-2. 
2.  T1-2 through T7-8 disc protrusions with mild flattening of the adjacent 
ventral cord. 
3.  C7-T1 and T1-2 mild central canal and neural foraminal stenosis.

## 2022-08-20 NOTE — Progress Notes (Signed)
 SUBJECTIVE:  Patient ID: Carolyn [name] is a 81 y.o. female  EPIC MRN: [id number]   Pain Score:3   Chief Complaint: Pain of the Lower Back    81 y.o. female presents today for follow up MRI Review of her cervical and lumbar spine. I last saw the patient on 06/26/2021 where she reported neck pain with radiation into both forearms and into her hands (hx of carpal tunnel syndrome as well). Pt also endorsed pain along the lateral portion of the left lower leg. Her SIJ pain was most significant at that time. At that appointment, I referred the pt to upper extremity specialist for evaluation of carpal tunnel syndrome. I also referred her to pain management for epidural and SIJ injections.     Today the pt reports that she had injections done while in [place] . She found significant relief from SIJ ablation. However, she continues to experience discomfort in the SIJs and into the gluteals. She found minimal relief from thoracic epidurals and continues to experience a feeling of fatigue across the upper/mid back. Treatments so far initiated by patient are: PT, narcotics, Tylenol , NSAIDS, activity modification, injections. She had intra-articular hip injections with good relief, but acknowledges she will likely need her hips replaced soon.     PSHx: L4-S1 laminectomy and fusion.    Patient denies bowel or bladder incontinence. Patient comes in today for further evaluation and treatment.        01/19/2017     1:21 PM 03/31/2019     7:10 AM 08/13/2022     2:08 PM   PATIENT ENTERED SPINE QUESTIONNAIRE RESPONSES   When did your pain/symptoms start? Please provide a date, or estimated number of days/weeks/years 5 years numbness in hands 1 year ago, pain mid back, left shoulder 5years ago   How did the pain start? Has the pain changed? gradually  pain has gotten progressively worse No    Do you have numbness/tingling? Yes Yes Yes   If yes, where do you have numbness?  hands  Fingers on both hands and in right armpit  And wrist    Do you  have weakness?  Yes Yes Yes   If yes, where do you have weakness?  lower back,hips and knees Hands    How would you describe your pain on a scale of 0-10 with 10 being the most painful? 7 4    If you have pain, where is your pain located? Neck    Low Back    Right Leg/Foot Neck    Mid Back    Low Back    Right Arm/Hand    Left Arm/Hand    Left Leg/Foot    How would you describe your pain/symptoms?  Aching    Sharp/Stabbing aching    burning    sharp/stabbing    shooting    numbness aching    dull    sharp/stabbing    shooting    tingling    numbness   How often do you experience your pain/symptoms? Intermittent constant    intermittent constant   Do any of the following make your pain/symptoms WORSE? Standing    Changing Position    Walking exercising    changing position    walking    driving    lifting/carrying sitting    standing    exercising    walking   Do any of the following make your pain/symptoms BETTER?  Exercising sitting sitting    lying down  changing position   What treatment have you had for this specific episode of pain/symptoms? NSAIDS    Physical Therapy    Injections Tylenol  Tylenol     injections (steroid/cortisone)   How long have you had back or neck problems prior to this episode?  neck is recent,back is ongoing for years Several years    Prior to this episode, what treatments have you had for your neck and back in the past? NSAIDS    Physical Therapy    Injections Narcotics    Tylenol     Have you had any neck surgeries in the past?  No No No   Have you had any back surgeries in the past? No Yes Yes   If you have had spine surgery, please list all previous spine surgeries.  none Laminectomy and fusion    What is your employment status?   retired retired   Occupation?  retired          08/20/2022    Constitutional: Unexplained: Negative  Genitourinary: Frequent Urination: Negative  HEENT: Vision Loss: Negative  Neurological: Memory Loss: Positive  Integumentary: Rash: Negative  Cardiovascular:  Palpatations: Negative  Hematologic: Bruises/Bleeds Easily: Positive  Gastrointestinal: Constipation: Negative  Immunological: Seasonal Allergies: Negative  Musculoskeletal: Joint Pain: Positive    Patient Care Team:  Ilah Folks, MD as PCP - General (Internal Medicine)    OBJECTIVE:  Constitutional: No acute distress. Well nourished. Well developed. She body mass index is 24.34 kg/m.  Eyes:  Sclera are nonicteric.  Respiratory:  No labored breathing.  Cardiovascular:  No marked edema.  Skin:  No marked skin ulcers.  Neurological:  No marked sensory loss noted.  Psychiatric: Alert and oriented x3. Demonstrates normal mood and affect     Musculoskeletal     Exam unchanged since previous visit.    Grossly NVI  Normal Gait    PROCEDURE:  Procedures    IMAGING / STUDIES:    Cervical Spine MRI performed on 11/27/2021 at an outside facility is independently reviewed today and discussed in great detail with the patient.     MY IMPRESSION:  Spondylolisthesis  C7-T1 and T1-2 with compression worse on the right C4-7.    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 Office anatomic diagrams were used to help explain their pathology. I discussed my suspected diagnosis, its implications for improvement or worsening, and further diagnostic / therapeutic options. Daily activity levels were discussed. The pros, cons, alternatives, risks, and benefits of further Orthopaedic treatment vs. consideration of further diagnostic studies at this time were reviewed at length.     Patient denies gastrointestinal problems, bleeding problems and was  advised to take NSAIDS with food, if needed.  At this time, additional images are required to determine future treatment options. Order for Lumbar Spine MRI provided. Please bring the disc to your next appointment.  Referral for L3-4 epidural injections provided.   The patient agrees with above plan of care. All questions answered. We will see the patient back in after the above for f/u, or sooner on an as-needed basis.      Orders Placed This Encounter   .  MRI lumbar spine without contrast (27851)   . Ambulatory referral to Physical Medicine Rehab   . BMI Patient Education       I, Tanda Lovett, MD, personally, performed the services described in this documentation, as scribed in my presence, and it is both accurate and complete.  Scribed by: Fredirick Sherline Quint

## 2022-08-23 ENCOUNTER — Other Ambulatory Visit: Payer: Self-pay | Admitting: Orthopaedic Surgery

## 2022-09-01 ENCOUNTER — Other Ambulatory Visit (INDEPENDENT_AMBULATORY_CARE_PROVIDER_SITE_OTHER): Payer: Self-pay | Admitting: Obstetrics & Gynecology

## 2022-09-10 NOTE — Progress Notes (Signed)
 SUBJECTIVE:  Patient ID: Carolyn [name] is a 81 y.o. female  EPIC MRN: [id number]   Pain Score:3   Chief Complaint: Pain of the Lower Back    81 y.o. female presents today for follow up MRI review of her lumbar spine. I last saw the patient on 08/20/2022 where she reported significant relief from SIJ injections done in [place] . However, she had some remaining discomfort in SIJs and into the gluteals. Pt found minimal relief from thoracic epidurals and continued to experience a feeling of fatigue. At that appointment, I referred the pt for an MRI and for epidural injections.    Today she reports that her back pain is tolerable now (3/10). Pt found good relief from epidural injections. Her neck has been bothersome recently. Treatments so far initiated by patient are: PT, narcotics, Tylenol , NSAIDS, activity modification, injections (RFAs and epidurals). Of note, the pt is looking to have her hips replaced sometime next year. Pt received injections into the groin folds in mid-October. Injection was not done under ultrasound. She is leaving for [place]  in December.     PSHx: L4-S1 laminectomy and fusion.    The patient denies bowel or bladder incontinence. Patient comes in today for further evaluation and treatment.         01/19/2017     1:21 PM 03/31/2019     7:10 AM 08/13/2022     2:08 PM   PATIENT ENTERED SPINE QUESTIONNAIRE RESPONSES   When did your pain/symptoms start? Please provide a date, or estimated number of days/weeks/years 5 years numbness in hands 1 year ago, pain mid back, left shoulder 5years ago   How did the pain start? Has the pain changed? gradually  pain has gotten progressively worse No    Do you have numbness/tingling? Yes Yes Yes   If yes, where do you have numbness?  hands  Fingers on both hands and in right armpit  And wrist    Do you have weakness?  Yes Yes Yes   If yes, where do you have weakness?  lower back,hips and knees Hands    How would you describe your pain on a scale of 0-10 with 10 being  the most painful? 7 4    If you have pain, where is your pain located? Neck    Low Back    Right Leg/Foot Neck    Mid Back    Low Back    Right Arm/Hand    Left Arm/Hand    Left Leg/Foot    How would you describe your pain/symptoms?  Aching    Sharp/Stabbing aching    burning    sharp/stabbing    shooting    numbness aching    dull    sharp/stabbing    shooting    tingling    numbness   How often do you experience your pain/symptoms? Intermittent constant    intermittent constant   Do any of the following make your pain/symptoms WORSE? Standing    Changing Position    Walking exercising    changing position    walking    driving    lifting/carrying sitting    standing    exercising    walking   Do any of the following make your pain/symptoms BETTER?  Exercising sitting sitting    lying down    changing position   What treatment have you had for this specific episode of pain/symptoms? NSAIDS    Physical Therapy    Injections Tylenol   Tylenol     injections (steroid/cortisone)   How long have you had back or neck problems prior to this episode?  neck is recent,back is ongoing for years Several years    Prior to this episode, what treatments have you had for your neck and back in the past? NSAIDS    Physical Therapy    Injections Narcotics    Tylenol     Have you had any neck surgeries in the past?  No No No   Have you had any back surgeries in the past? No Yes Yes   If you have had spine surgery, please list all previous spine surgeries.  none Laminectomy and fusion    What is your employment status?   retired retired   Occupation?  retired           09/10/2022    Constitutional: Unexplained: Negative  Genitourinary: Frequent Urination: Negative  HEENT: Vision Loss: Negative  Neurological: Memory Loss: Positive  Integumentary: Rash: Negative  Cardiovascular: Palpatations: Negative  Hematologic: Bruises/Bleeds Easily: Positive  Gastrointestinal: Constipation: Negative  Immunological: Seasonal Allergies:  Negative  Musculoskeletal: Joint Pain: Positive    Patient Care Team:  Ilah Folks, MD as PCP - General (Internal Medicine)    OBJECTIVE:  Constitutional: No acute distress. Well nourished. Well developed. She body mass index is 24.24 kg/m.  Eyes:  Sclera are nonicteric.  Respiratory:  No labored breathing.  Cardiovascular:  No marked edema.  Skin:  No marked skin ulcers.  Neurological:  No marked sensory loss noted.  Psychiatric: Alert and oriented x3. Demonstrates normal mood and affect     Musculoskeletal     Lumbar Spine Exam    Exam unchanged since previous visit.    Grossly NVI  Normal Gait      PROCEDURE:  Procedures    IMAGING / STUDIES:    Lumbar Spine MRI performed on 08/23/2022 at an outside facility is independently reviewed today and discussed in great detail with the patient.     FINDINGS:  Again seen are surgical changes from previous L4-L5 and L5-S1 posterior spinal decompression and fusion. There are bilateral pedicle screws affixed by vertical rods. There is a probable seroma in the laminectomy bed at L4-L5 eccentric to the right of midline. This is smaller in the interval.     Vertebral body heights are maintained. There is multilevel Schmorl's node formation. Grade 1 retrolistheses are seen at L1-L2, L2-L3, L3-L4 L5-S1. There is a grade 1 anterolisthesis of L4-L5.     There is no soft tissue or marrow edema. The conus medullaris has a normal appearance at level of T12-L1.     There is diffuse intrinsic disc degeneration, disc space narrowing, disc  bulging, facet arthropathy and ligamentous hypertrophy.      L1-L2: There is mild central stenosis, stenosis of each lateral recess and  moderate-severe bilateral foraminal stenosis. This level is stable.     L2-L3: There is moderate central stenosis, stenosis of each lateral recess and moderate-severe bilateral foraminal stenosis. This level is stable.     L3-L4: There is moderate central stenosis, stenosis of each lateral recess and severe  bilateral foraminal stenosis with foraminal stenoses progressive in the study interval.     L4-L5: There is no central stenosis. There is mild right and mild-moderate left foraminal stenosis. This level is stable.     L5-S1: There is no central stenosis. There is stenosis of the right lateral  recess, moderate right foraminal and mild left foraminal stenosis. This  level is stable.  RADIOLOGIST'S IMPRESSION:  1.  Postsurgical and degenerative changes of the lumbar spine as  detailed above.  2.  Spinal alignment as above.  MY IMPRESSION:  I agree with the above impressions.    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 Office anatomic diagrams were used to help explain their pathology. I discussed my suspected diagnosis, its implications for improvement or worsening, and further diagnostic / therapeutic options. Daily activity levels were discussed. The pros, cons, alternatives, risks, and benefits of further Orthopaedic treatment vs. consideration of further diagnostic studies at this time were reviewed at length.     Patient denies gastrointestinal problems, bleeding problems and was advised to take NSAIDS with food, if needed.   At this time, I recommend that the patient stay the course and continue with at home exercises and stretching. Continue to monitor for the return or decline of symptoms.   Referral for PT to focus on dynamic cervical stabilization, ROM, and strengthening provided.   The patient agrees with above plan of care. All questions answered. We will see the patient back in 6 months for  f/u, or sooner on an as-needed basis.      Orders Placed This Encounter   . Ambulatory referral to Physical Therapy   . BP Patient Education       I, Tanda Lovett, MD, personally, performed the services described in this documentation, as scribed in my presence, and it is both accurate and complete.  Scribed by: Fredirick Sherline Quint

## 2023-06-29 NOTE — Progress Notes (Signed)
 CARE TEAM:  Patient Care Team:  Ilah Folks, MD as PCP - General (Internal Medicine)    HISTORY OF PRESENT ILLNESS  Chief Complaint: Pain of the Left Shoulder   Age: 82 y.o.  Sex: female     Hand-dominance: ambidextrous    History of present illness: Carolyn [name] presents today for evaluation of left shoulder pain. Patient reports that she was playing golf when she was in the rough, and developed sudden pain onset of pain to the [facility] joint.  She has not regularly been taking medication, however she notes that she takes Tylenol  on occasion for symptom relief.     Past Medical History:   Diagnosis Date   . Hyperlipidemia    . Osteoarthritis      Past Surgical History:   Procedure Laterality Date   . FOOT SURGERY     . JOINT REPLACEMENT     . KNEE SURGERY     . NO RELEVANT SURGERIES     . SPINE SURGERY       Tobacco/Alcohol/Drugs: none  Occupation: retired     OBJECTIVE  Constitutional:  No acute distress. Her body mass index is 25.11 kg/m.   Eyes:  Sclera are nonicteric.  Respiratory:  No labored breathing.  Cardiovascular:  No marked edema.  Skin:  No marked skin ulcers.  Neurological:  No marked sensory loss noted.  Psychiatric: Alert and oriented x3.    Musculoskeletal exam:  Left upper extremity:  Skin intact  Sensation intact to light touch to median/radial/ulnar nerves  Motor intact to anterior interosseous, posterior interosseous, and ulnar nerves  Fingers warm, well-perfused  2+ radial pulse    Left shoulder:  Forward flexion: 150  Forward flexion strength: 3/5  External rotation: 60  External rotation strength: 3/5  Internal rotation: T7  Lift off test: negative  Hawkins sign: positive   Speed's test: positive   Crepitus with ROM  Evidence of anterior superior migration of the humeral head    IMAGING / STUDIES   Order: XR SHOULDER 2+ VW LEFT - Indication: Left rotator cuff tear   arthropathy      X-ray shoulder left 2+ views ([id number])    Result Date: 06/30/2023  Shielding: Shielded. Grashey, Axillary, Outlet.      Impression: The X-rays were personally reviewed and interpreted. These demonstrate left rotator cuff arthropathy.       PROCEDURES    Large Joint Arthrocentesis: L subacromial bursa  Performed by: Jama Lenis, MD  Authorized by: Jama Lenis, MD      Consent given by: patient  Supporting Documentation  Indications: pain and diagnostic evaluation   Procedure Details  Location:  Shoulder L subacromial bursa   Preparation: ethylchloride used.  Needle size: 22 G    Approach: posterior  Patient tolerance: patient tolerated the procedure well with no immediate complications    Medications administered: 1 mL Triamcinolone 40 MG/ML; 4 mL lidocaine  1 %          ASSESSMENT  1. Left rotator cuff tear arthropathy         Diagnoses, DOI/DOS:   Left rotator cuff tear arthropathy s/p 1 injection 06/30/23    IMPRESSION/PLAN  I have personally reviewed and interpreted the patient's prior diagnostic testing, relevant imaging, and external notes from their other treating physicians in the process of formulating my assessment and plan of care for their current symptoms and pathology.   The diagnosis and treatment options were discussed and reviewed in detail with the patient, who  communicated understanding with the plan.  I reviewed the patient's x-ray and discussed the findings with the patient.   Patient seems to have developed left rotator cuff arthropathy.  I offered a cortisone injection today, which the patient elected to proceed with.  I recommend that she continue taking Celebrex  at this time for symptom relief.   A left subacromial shoulder injection was provided today.   If her symptoms persist or worsen, we may consider another injection at a later date.     Orders Placed This Encounter   . Large Joint Arthrocentesis: L subacromial bursa   . X-ray shoulder left 2+ views (73030)   . BMI Patient Education   . lidocaine  (XYLOCAINE ) 1 % injection 4 mL   . Triamcinolone (KENALOG-40) 40 MG/ML injection 1 mL        Follow-up:    Return if symptoms worsen or fail to improve.    IAlm Ruth, MD, personally performed the services described in the documentation as scribed in my presence, and it is both accurate and complete.  Scribed by: Leim Going

## 2023-07-13 NOTE — Progress Notes (Signed)
 CARE TEAM:  Patient Care Team:  Carolyn Folks, MD as PCP - General (Internal Medicine)    HISTORY OF PRESENT ILLNESS  Chief Complaint: Follow-up of the Left Shoulder   Age: 82 y.o.  Sex: female     Hand-dominance: ambidextrous    History of present illness: [name] presents for follow-up of left shoulder pain. Patient reports that she was playing golf when she was in the rough, and developed sudden pain onset of pain to the [facility] joint.  She has not regularly been taking medication, however she notes that she takes Tylenol  on occasion for symptom relief.     DLS, 0/89/75, at which time patient was provided a left subacromial shoulder injection. Today, she reports she still has some pain. She notes that the last injection did not provide significant relief, however she states that she had a flu shot the day before. Of note, she has been taking the Celebrex  as needed for symptom relief. She reports that she was pulling a dish towel the other day when she felt pain to the site.     Past Medical History:   Diagnosis Date   . Hyperlipidemia    . Osteoarthritis      Past Surgical History:   Procedure Laterality Date   . FOOT SURGERY     . JOINT REPLACEMENT     . KNEE SURGERY     . NO RELEVANT SURGERIES     . SPINE SURGERY       Tobacco/Alcohol/Drugs: none  Occupation: retired     OBJECTIVE  Constitutional:  No acute distress. Her body mass index is 25.11 kg/m.   Eyes:  Sclera are nonicteric.  Respiratory:  No labored breathing.  Cardiovascular:  No marked edema.  Skin:  No marked skin ulcers.  Neurological:  No marked sensory loss noted.  Psychiatric: Alert and oriented x3.    Musculoskeletal exam:  Left upper extremity:  Skin intact  Sensation intact to light touch to median/radial/ulnar nerves  Motor intact to anterior interosseous, posterior interosseous, and ulnar nerves  Fingers warm, well-perfused  2+ radial pulse     Left shoulder:  Forward flexion: 150  Forward flexion strength: 3/5  External rotation:  60  External rotation strength: 3/5  Internal rotation: T7  Lift off test: negative  Hawkins sign: positive   Speed's test: positive   Crepitus with ROM  Evidence of anterior superior migration of the humeral head     IMAGING / STUDIES         No imaging obtained     PROCEDURES    Large Joint Arthrocentesis: L glenohumeral  Performed by: Jama Lenis, MD  Authorized by: Jama Lenis, MD      Consent given by: patient  Supporting Documentation  Indications: pain and diagnostic evaluation   Procedure Details  Location:  Shoulder L glenohumeral     Needle size: 22 G    Approach: posterior  Patient tolerance: patient tolerated the procedure well with no immediate complications    Medications administered: 1 mL Triamcinolone 40 MG/ML; 4 mL lidocaine  1 %          ASSESSMENT  1. Left rotator cuff tear arthropathy         Diagnoses, DOI/DOS:   Left rotator cuff tear arthropathy s/p 2 injections, most recently 07/14/23    IMPRESSION/PLAN  Patient's symptoms are still persistent following the last injection. I offered another cortisone injection today, which the patient elected to proceed with.  A left  glenohumeral injection was provided today.  If symptoms persist or worsen, I explained to the patient that we may consider operative treatment for a shoulder replacement to restore her motion at a later date.  Patient elects to proceed with therapy at this time for symptom relief.    Referral for PT provided.     Orders Placed This Encounter   . Large Joint Arthrocentesis: L glenohumeral   . Ambulatory referral to Physical Therapy   . lidocaine  (XYLOCAINE ) 1 % injection 4 mL   . Triamcinolone (KENALOG-40) 40 MG/ML injection 1 mL        Follow-up:   Return in about 2 months (around 09/13/2023).    IAlm Ruth, MD, personally performed the services described in the documentation as scribed in my presence, and it is both accurate and complete.  Scribed by: Leim Going

## 2023-11-06 IMAGING — MG MAMMOGRAPHY DIAGNOSTIC BILATERAL 3[PERSON_NAME]
8 series · 8 of 24 positions shown · non-contrast
Comparison: Mammogram of 09/01/2022. Ultrasound and mammogram of 09/09/2021.

________________________________________________________________________________________________ 
MAMMOGRAPHY DIAGNOSTIC BILATERAL 3BASS, [facility] BREAST ULTRASOUND UNILATERAL 
LIMITED, 11/06/2023 [DATE]: 
CLINICAL INDICATION: Left breast lump for 2 months. History of left cyst.
TECHNIQUE: Mammogram: Digital bilateral mammograms and 3-D Tomosynthesis were 
obtained. These were interpreted both primarily and with the aid of 
computer-aided detection system. Ultrasound: Ultrasound performed of the left 
breast, limited. 
BREAST DENSITY: (Level D) The breasts are extremely dense, which lowers the 
sensitivity of mammography.

[R CC]
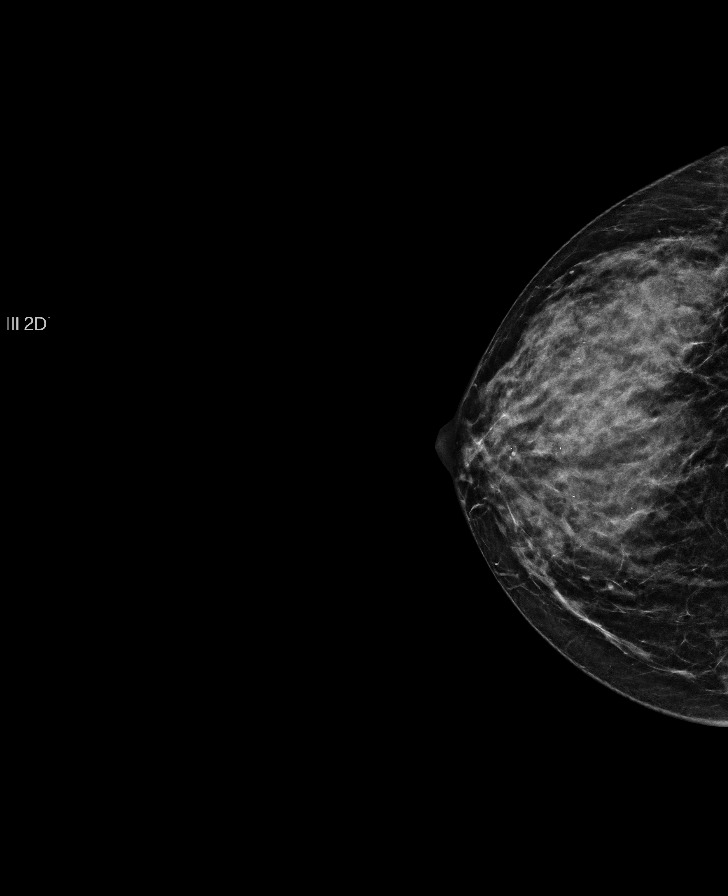

[R MLO]
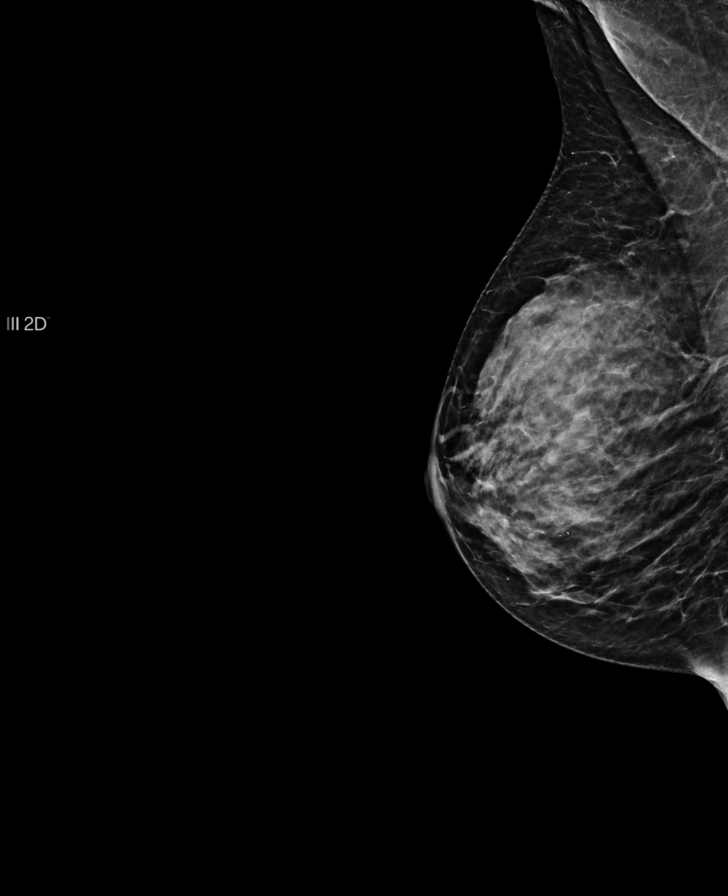

[L CC]
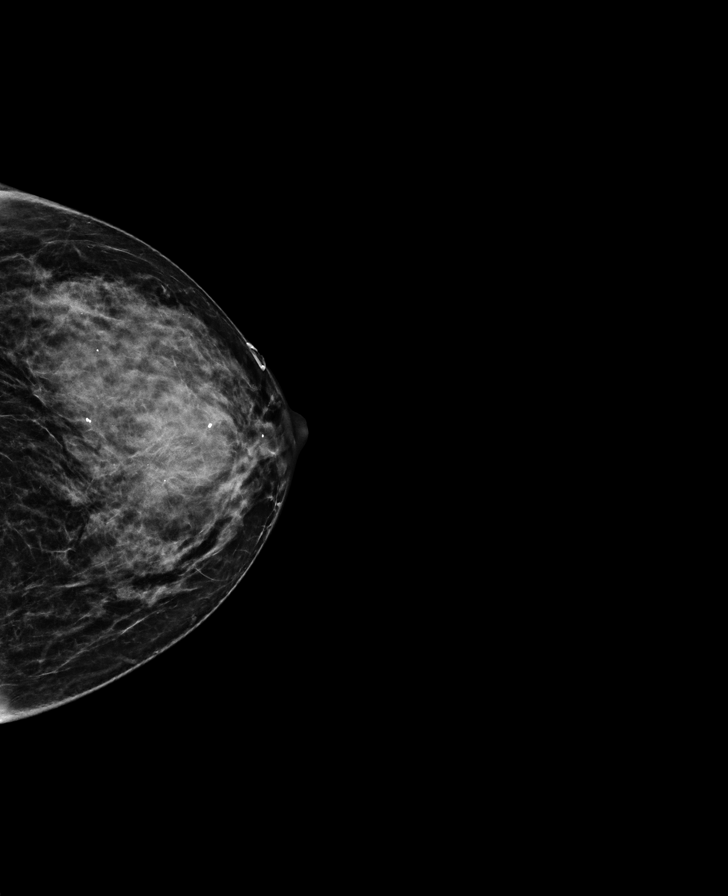

[L MLO]
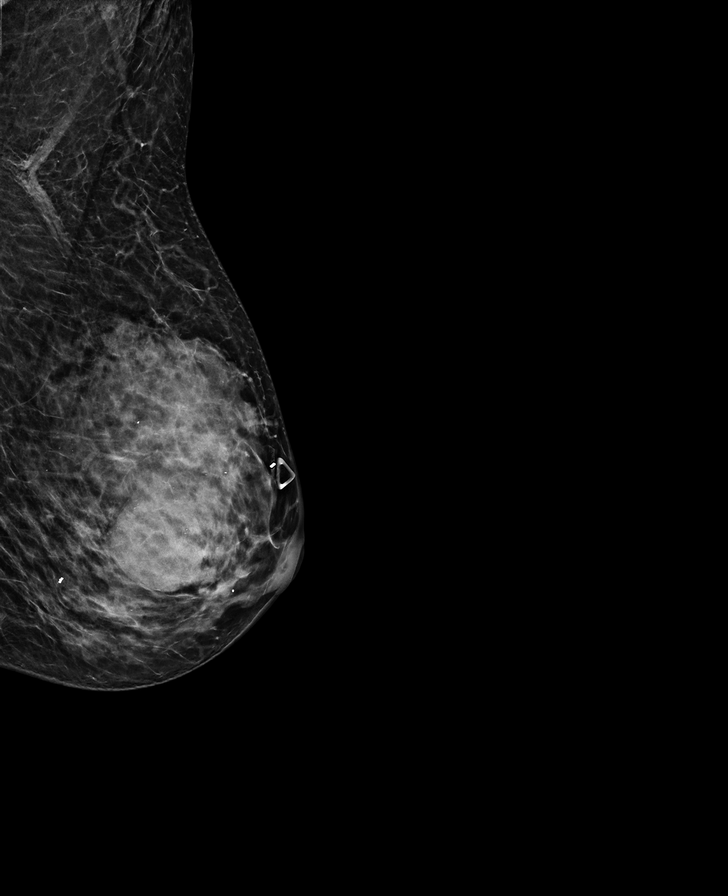

[L MLO tomo · tomo slice 10/19.0]
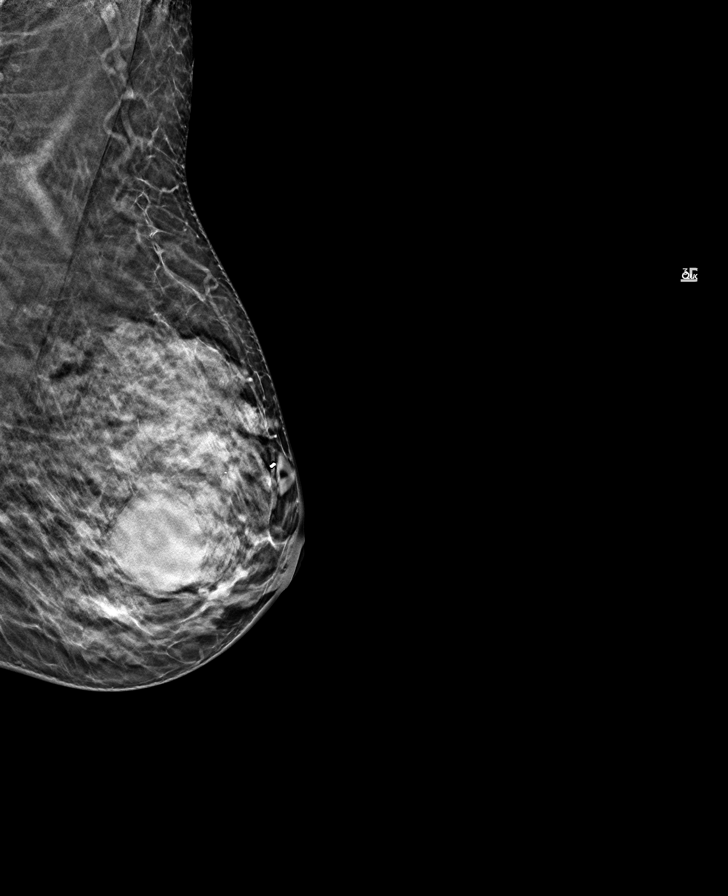

[R CC tomo · tomo slice 9/17.0]
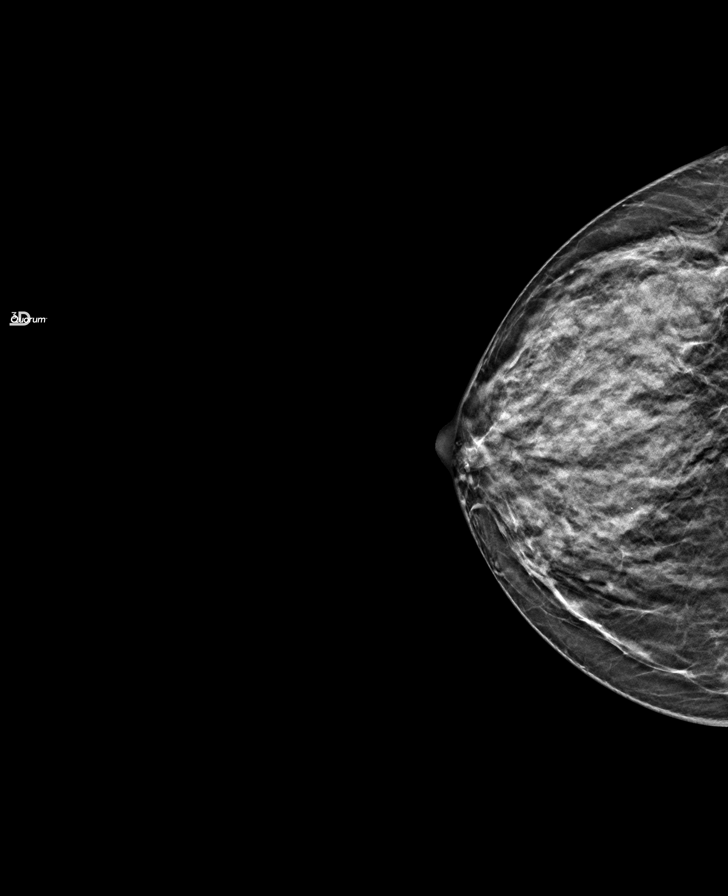

[R MLO tomo · tomo slice 9/18.0]
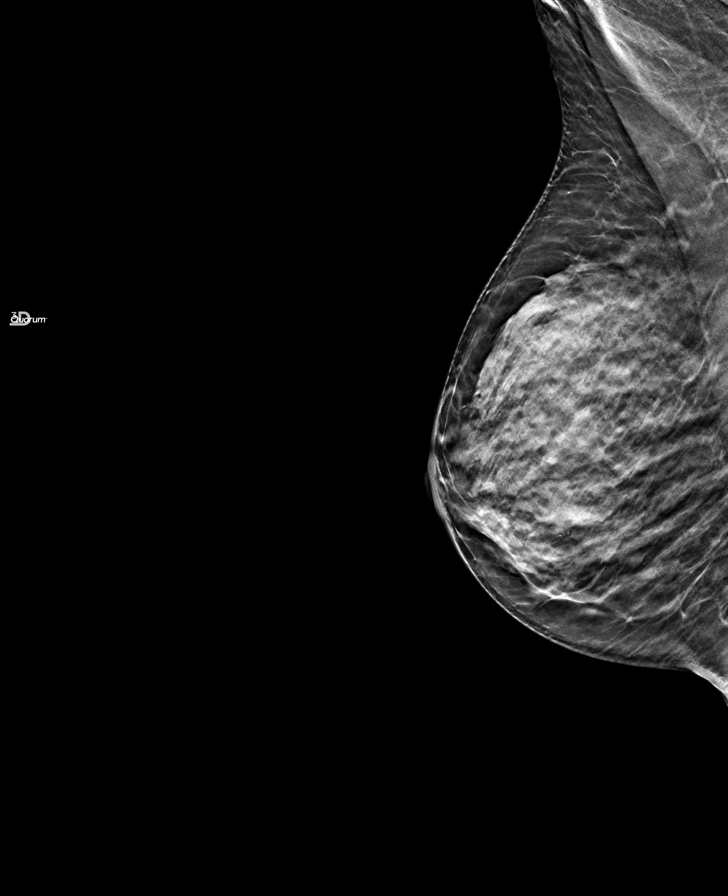

[L CC tomo · tomo slice 10/19.0]
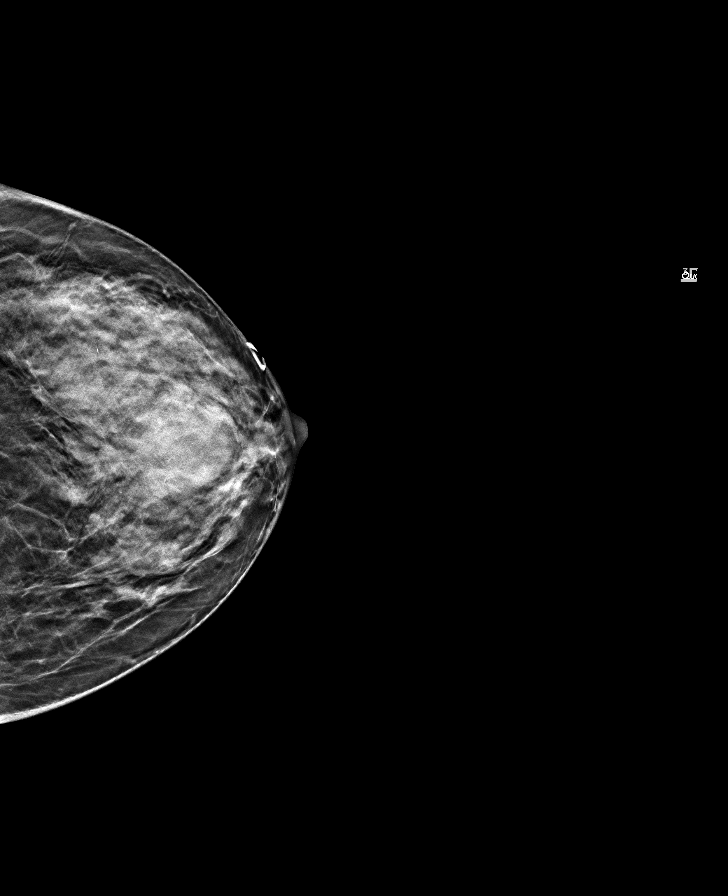

[8 of 24 positions shown; findings below may reference images not displayed]

FINDINGS: Mammogram: 3 cm circumscribed lesion left breast compatible with known cyst. No 
suspicious mass, calcifications, or area of architectural distortion in either 
breast. Overall stable mammographic appearance. 
Ultrasound: Ultrasound was performed of the left breast at area of palpable 
concern from the 1 to [DATE] position. There is a cyst at the [DATE] position, 1 cm 
from the nipple measuring 3.4 x 2.1 x 2.9 cm, previously 2.6 x 1.2 x 2.9 cm. No 
solid mass. No lymphadenopathy.
IMPRESSION: No mammographic or ultrasonographic findings suggestive for malignancy. 
(BI-RADS 2) Benign findings. Routine mammographic follow-up is recommended.

## 2024-05-20 HISTORY — PX: HIP SURGERY: SHX245

## 2024-08-02 ENCOUNTER — Observation Stay
Admission: EM | Admit: 2024-08-02 | Discharge: 2024-08-03 | Disposition: A | Attending: Internal Medicine | Admitting: Internal Medicine

## 2024-08-02 ENCOUNTER — Emergency Department

## 2024-08-02 ENCOUNTER — Observation Stay

## 2024-08-02 DIAGNOSIS — R0602 Shortness of breath: Secondary | ICD-10-CM | POA: Insufficient documentation

## 2024-08-02 DIAGNOSIS — G454 Transient global amnesia: Principal | ICD-10-CM

## 2024-08-02 DIAGNOSIS — Z7982 Long term (current) use of aspirin: Secondary | ICD-10-CM | POA: Insufficient documentation

## 2024-08-02 DIAGNOSIS — R569 Unspecified convulsions: Secondary | ICD-10-CM | POA: Insufficient documentation

## 2024-08-02 DIAGNOSIS — M1990 Unspecified osteoarthritis, unspecified site: Secondary | ICD-10-CM | POA: Insufficient documentation

## 2024-08-02 DIAGNOSIS — Z79899 Other long term (current) drug therapy: Secondary | ICD-10-CM | POA: Insufficient documentation

## 2024-08-02 DIAGNOSIS — R4182 Altered mental status, unspecified: Secondary | ICD-10-CM

## 2024-08-02 DIAGNOSIS — I639 Cerebral infarction, unspecified: Secondary | ICD-10-CM

## 2024-08-02 DIAGNOSIS — E785 Hyperlipidemia, unspecified: Secondary | ICD-10-CM | POA: Insufficient documentation

## 2024-08-02 LAB — LAB USE ONLY - CBC WITH DIFFERENTIAL
Absolute Basophils: 0.06 x10 3/uL (ref 0.00–0.08)
Absolute Basophils: 0.05 x10 3/uL (ref 0.00–0.08)
Absolute Eosinophils: 0.06 x10 3/uL (ref 0.00–0.44)
Absolute Eosinophils: 0.04 x10 3/uL (ref 0.00–0.44)
Absolute Immature Granulocytes: 0.03 x10 3/uL (ref 0.00–0.07)
Absolute Immature Granulocytes: 0.02 x10 3/uL (ref 0.00–0.07)
Absolute Lymphocytes: 1.45 x10 3/uL (ref 0.42–3.22)
Absolute Lymphocytes: 1.64 x10 3/uL (ref 0.42–3.22)
Absolute Monocytes: 0.44 x10 3/uL (ref 0.21–0.85)
Absolute Monocytes: 0.52 x10 3/uL (ref 0.21–0.85)
Absolute Neutrophils: 6.65 x10 3/uL — ABNORMAL HIGH (ref 1.10–6.33)
Absolute Neutrophils: 5.33 x10 3/uL (ref 1.10–6.33)
Absolute nRBC: 0 x10 3/uL (ref <=0.00)
Absolute nRBC: 0 x10 3/uL (ref <=0.00)
Basophils %: 0.7 %
Basophils %: 0.7 %
Eosinophils %: 0.7 %
Eosinophils %: 0.5 %
Hematocrit: 40.3 % (ref 34.7–43.7)
Hematocrit: 42.4 % (ref 34.7–43.7)
Hemoglobin: 13.3 g/dL (ref 11.4–14.8)
Hemoglobin: 13.7 g/dL (ref 11.4–14.8)
Immature Granulocytes %: 0.3 %
Immature Granulocytes %: 0.3 %
Lymphocytes %: 16.7 %
Lymphocytes %: 21.6 %
MCH: 30.9 pg (ref 25.1–33.5)
MCH: 30.2 pg (ref 25.1–33.5)
MCHC: 33 g/dL (ref 31.5–35.8)
MCHC: 32.3 g/dL (ref 31.5–35.8)
MCV: 93.5 fL (ref 78.0–96.0)
MCV: 93.6 fL (ref 78.0–96.0)
MPV: 11.7 fL (ref 8.9–12.5)
MPV: 12.1 fL (ref 8.9–12.5)
Monocytes %: 5.1 %
Monocytes %: 6.8 %
Neutrophils %: 76.5 %
Neutrophils %: 70.1 %
Platelet Count: 235 x10 3/uL (ref 142–346)
Platelet Count: 237 x10 3/uL (ref 142–346)
Preliminary Absolute Neutrophil Count: 6.65 x10 3/uL — ABNORMAL HIGH (ref 1.10–6.33)
Preliminary Absolute Neutrophil Count: 5.33 x10 3/uL (ref 1.10–6.33)
RBC: 4.31 x10 6/uL (ref 3.90–5.10)
RBC: 4.53 x10 6/uL (ref 3.90–5.10)
RDW: 12 % (ref 11–15)
RDW: 12 % (ref 11–15)
WBC: 8.69 x10 3/uL (ref 3.10–9.50)
WBC: 7.6 x10 3/uL (ref 3.10–9.50)
nRBC %: 0 /100{WBCs} (ref <=0.0)
nRBC %: 0 /100{WBCs} (ref <=0.0)

## 2024-08-02 LAB — COMPREHENSIVE METABOLIC PANEL
ALT: 22 U/L (ref <=55)
ALT: 18 U/L (ref <=55)
AST (SGOT): 30 U/L (ref <=41)
AST (SGOT): 33 U/L (ref <=41)
Albumin/Globulin Ratio: 1.6 (ref 0.9–2.2)
Albumin/Globulin Ratio: 1.4 (ref 0.9–2.2)
Albumin: 4.2 g/dL (ref 3.5–4.9)
Albumin: 4.2 g/dL (ref 3.5–4.9)
Alkaline Phosphatase: 63 U/L (ref 37–117)
Alkaline Phosphatase: 65 U/L (ref 37–117)
Anion Gap: 10 (ref 5.0–15.0)
Anion Gap: 11 (ref 5.0–15.0)
BUN: 14 mg/dL (ref 7–21)
BUN: 13 mg/dL (ref 7–21)
Bilirubin, Total: 0.6 mg/dL (ref 0.2–1.2)
Bilirubin, Total: 0.6 mg/dL (ref 0.2–1.2)
CO2: 21 meq/L (ref 17–29)
CO2: 21 meq/L (ref 17–29)
Calcium: 9.4 mg/dL (ref 7.9–10.2)
Calcium: 9.7 mg/dL (ref 7.9–10.2)
Chloride: 110 meq/L (ref 99–111)
Chloride: 110 meq/L (ref 99–111)
Creatinine: 0.9 mg/dL (ref 0.4–1.0)
Creatinine: 0.8 mg/dL (ref 0.4–1.0)
GFR: >60 mL/min/1.73 m2 (ref >=60.0)
GFR: >60 mL/min/1.73 m2 (ref >=60.0)
Globulin: 2.6 g/dL (ref 2.0–3.6)
Globulin: 3 g/dL (ref 2.0–3.6)
Glucose: 122 mg/dL — ABNORMAL HIGH (ref 70–100)
Glucose: 84 mg/dL (ref 70–100)
Potassium: 4.2 meq/L (ref 3.5–5.3)
Potassium: 4.4 meq/L (ref 3.5–5.3)
Protein, Total: 6.8 g/dL (ref 6.0–8.3)
Protein, Total: 7.2 g/dL (ref 6.0–8.3)
Sodium: 141 meq/L (ref 135–145)
Sodium: 142 meq/L (ref 135–145)

## 2024-08-02 LAB — URINALYSIS WITH REFLEX TO MICROSCOPIC EXAM - REFLEX TO CULTURE
Urine Bilirubin: NEGATIVE
Urine Blood: NEGATIVE
Urine Glucose: NEGATIVE
Urine Ketones: NEGATIVE mg/dL
Urine Leukocyte Esterase: NEGATIVE
Urine Nitrite: NEGATIVE
Urine Protein: NEGATIVE
Urine Specific Gravity: 1.007 (ref 1.001–1.035)
Urine Urobilinogen: NORMAL mg/dL (ref 0.2–2.0)
Urine pH: 6.5 (ref 5.0–8.0)

## 2024-08-02 LAB — ECG 12-LEAD
Atrial Rate: 94 {beats}/min
Atrial Rate: 79 {beats}/min
IHS MUSE NARRATIVE AND IMPRESSION: NORMAL
P Axis: 79 degrees
P Axis: 78 degrees
P-R Interval: 170 ms
P-R Interval: 168 ms
Q-T Interval: 352 ms
Q-T Interval: 372 ms
QRS Duration: 74 ms
QRS Duration: 80 ms
QTC Calculation (Bezet): 440 ms
QTC Calculation (Bezet): 426 ms
R Axis: 68 degrees
R Axis: 79 degrees
T Axis: 59 degrees
T Axis: 69 degrees
Ventricular Rate: 94 {beats}/min
Ventricular Rate: 79 {beats}/min

## 2024-08-02 LAB — COVID-19 (SARS-COV-2) & INFLUENZA  A/B, NAA (ROCHE LIAT)
Influenza A RNA: NOT DETECTED
Influenza B RNA: NOT DETECTED
SARS-CoV-2 (COVID-19) RNA: NOT DETECTED

## 2024-08-02 LAB — WHOLE BLOOD GLUCOSE POCT: Whole Blood Glucose POCT: 134 mg/dL — ABNORMAL HIGH (ref 70–100)

## 2024-08-02 LAB — TSH: TSH: 1.83 u[IU]/mL (ref 0.35–4.94)

## 2024-08-02 LAB — PHOSPHORUS: Phosphorus: 3.1 mg/dL (ref 2.3–4.7)

## 2024-08-02 LAB — LAB USE ONLY - URINE GRAY CULTURE HOLD TUBE

## 2024-08-02 LAB — MAGNESIUM: Magnesium: 2.2 mg/dL (ref 1.6–2.6)

## 2024-08-02 MED ORDER — ASPIRIN 81 MG PO CHEW
162.0000 mg | CHEWABLE_TABLET | Freq: Once | ORAL | Status: AC
Start: 2024-08-02 — End: 2024-08-02
  Administered 2024-08-02: 162 mg via ORAL
  Filled 2024-08-02: qty 2

## 2024-08-02 MED ORDER — ATORVASTATIN CALCIUM 10 MG PO TABS
10.0000 mg | ORAL_TABLET | Freq: Every day | ORAL | Status: DC
Start: 2024-08-02 — End: 2024-08-03
  Administered 2024-08-03: 10 mg via ORAL
  Filled 2024-08-02 (×2): qty 1

## 2024-08-02 MED ORDER — ENOXAPARIN SODIUM 40 MG/0.4ML IJ SOSY
40.0000 mg | PREFILLED_SYRINGE | INTRAMUSCULAR | Status: DC
Start: 2024-08-03 — End: 2024-08-03
  Administered 2024-08-03: 40 mg via SUBCUTANEOUS
  Filled 2024-08-02: qty 0.4

## 2024-08-02 MED ORDER — DIAZEPAM 5 MG/ML IJ SOLN
2.5000 mg | Freq: Once | INTRAMUSCULAR | Status: DC | PRN
Start: 2024-08-02 — End: 2024-08-02

## 2024-08-02 MED ORDER — HYDRALAZINE HCL 20 MG/ML IJ SOLN
10.0000 mg | INTRAMUSCULAR | Status: DC | PRN
Start: 2024-08-02 — End: 2024-08-03

## 2024-08-02 MED ORDER — DIAZEPAM 5 MG/ML IJ SOLN
2.5000 mg | INTRAMUSCULAR | Status: DC | PRN
Start: 2024-08-02 — End: 2024-08-03
  Filled 2024-08-02: qty 2

## 2024-08-02 MED ORDER — ASPIRIN 300 MG RE SUPP
300.0000 mg | Freq: Every day | RECTAL | Status: DC
Start: 2024-08-03 — End: 2024-08-03

## 2024-08-02 MED ORDER — LABETALOL HCL 5 MG/ML IV SOLN (WRAP)
10.0000 mg | INTRAVENOUS | Status: DC | PRN
Start: 2024-08-02 — End: 2024-08-03

## 2024-08-02 MED ORDER — ASPIRIN 81 MG PO CHEW
81.0000 mg | CHEWABLE_TABLET | Freq: Every day | ORAL | Status: DC
Start: 2024-08-03 — End: 2024-08-03
  Administered 2024-08-03: 81 mg via ORAL
  Filled 2024-08-02: qty 1

## 2024-08-02 NOTE — Progress Notes (Signed)
 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 to figure out what's majorly wrong        Important/Safety Information:   Mobility prior to admission: independent     Mobility devices used prior to admission: none     Pt admitted from (home/ NH/ SNF):home       Admission Data Collected:               YES

## 2024-08-02 NOTE — Progress Notes (Signed)
 NURSING ADMISSION NOTE     Date Time: 08/02/2024  Patient Name: Carolyn [name]  Attending Physician: Dr. Sable Bossier, MD     Date of Admission:   08/02/2024     Reason for Admission:   Transient global amnesia     Admitted from:   ED - IND from home     Nursing Note:   Patient Aox4. English speaking, and VSS. Oriented patient to the floor and POC. Bed to low position. Call bell within reach. Checked by two nurses that Tele in place with Nat verified by calling [facility]. Both Nurses verified the rhythm on Tele.     Patient scores as low/mod/high: high. Patient ambulates: IND.  Bed alarm on and floor mats in place. Patient belongings reviewed. Patient reports pain as 0/10. Safety interventions in place.    4 eyes in 4 hours pressure injury assessment note:       Completed with: Nat, RN  Unit & Time admitted: PSB2, 1640                                                                                                     Bony Prominences: Check appropriate box; if wound is present enter wound assessment in LDA              Occiput:                 [x] WNL  []  Wound present  Face:                     [x] WNL  []  Wound present  Ears:                      [x] WNL         []  Wound present  Spine:                    [x] WNL         []  Wound present  Shoulders:             [x] WNL         []  Wound present  Elbows:                  [x] WNL         []  Wound present  Sacrum/coccyx:     [x] WNL         []  Wound present  Ischial Tuberosity:  [x] WNL        []  Wound present  Trochanter/Hip:      [x] WNL       []  Wound present  Knees:                   [x] WNL         []  Wound present  Ankles:                   [x] WNL        []  Wound present  Heels:                    [  x]WNL         []  Wound present  Other pressure areas:      []  Wound location        Device related: []  Device name:           LDA completed if wound present: yes/no  Consult WOCN if necessary     Other skin related issues, ie tears, rash, etc, document in Integumentary  flowsheet    Plan:  MRI brain  EEG routine  NIHSS  Neuro check q4h  Swallowing test -- regular diet ordered  Stroke neuro following

## 2024-08-02 NOTE — ED to IP RN Note (Signed)
 [facility] EMERGENCY DEPT  ED NURSING NOTE FOR THE RECEIVING INPATIENT NURSE   ED NURSE Mallie DUTY [id number]   ED CHARGE RN Lyss   ADMISSION INFORMATION   Carolyn [name] is a 83 y.o. female admitted with an ED diagnosis of:    1. TGA (transient global amnesia)         Isolation: None   Allergies: Patient has no known allergies.   Holding Orders confirmed? No   Belongings Documented? No   Home medications sent to pharmacy confirmed? No   NURSING CARE   Patient Comes From:   Mental Status: Home Independent  confused   ADL: Independent with all ADLs   Ambulation: no difficulty   Pertinent Information  and Safety Concerns:     Broset Violence Risk Level: Moderate Pt ambulatory to triage c/o short term memory loss. Per pt's husband, pt is unable to recall that she had hip surgery in august, that she bought a new car last week, and other recent events. Pt disoriented to time and situation, typically A&Ox4 at baseline per husband. Pt went to sleep at baseline last night around 2300, woke up this AM around 0700 w/ the confusion. No facial droop noted at triage, grip strength equal bilaterally, no arm drift     CT / NIH   CT Head ordered on this patient?  Yes   NIH/Dysphagia assessment done prior to admission? No   VITAL SIGNS (at the time of this note)      Vitals:    08/02/24 1159   BP: 169/86   Pulse: 89   Resp:    Temp:    SpO2: 99%     Pain Score: 0-No pain (08/02/24 1102)

## 2024-08-02 NOTE — Progress Notes (Signed)
ADULT ROUTINE EEG performed as ordered. Report to follow.

## 2024-08-02 NOTE — Plan of Care (Signed)
 Problem: Neurological Deficit  Goal: Neurological status is stable or improving  Outcome: Progressing     Problem: Potential for Aspiration  Goal: Risk of aspiration will be minimized  Outcome: Progressing     Problem: Peripheral Neurovascular Impairment  Goal: Extremity color, movement, sensation are maintained or improved  Outcome: Progressing     Problem: Impaired Mobility  Goal: Mobility/Activity is maintained at optimal level for patient  Outcome: Progressing     Problem: Communication Impairment  Goal: Will be able to express needs and understand communication  Outcome: Progressing     Problem: Day of Admission - Stroke  Goal: Core/Quality measure requirements - Admission  Outcome: Progressing     Problem: Every Day - Stroke  Goal: Neurological status is stable or improving  Outcome: Progressing  Goal: Mobility/Activity is maintained at optimal level for patient  Outcome: Progressing  Goal: Core/Quality measure requirements - Daily  Outcome: Progressing  Goal: Stable vital signs and fluid balance  Outcome: Progressing  Goal: Patient will maintain adequate oxygenation  Outcome: Progressing  Goal: Patient's risk of aspiration will be minimized  Outcome: Progressing  Goal: Nutritional intake is adequate  Outcome: Progressing  Goal: Elimination patterns are normal or improving  Outcome: Progressing  Goal: Skin integrity is maintained or improved  Outcome: Progressing  Goal: Neurovascular status is stable or improving  Outcome: Progressing  Goal: Effective coping demonstrated  Outcome: Progressing

## 2024-08-02 NOTE — H&P (Addendum)
 [facility]  CNS Hospitalists  History and Physical        Assessment / Plan:        Carolyn [name] is a 83 y.o. female wit a past medical history of hyperlipidemia presented on 08/02/2024 with acute onset short-term memory loss.     #Acute onset short-term memory loss. Etiology consistent with TGA, however will need to r/o hippocampal CVA   -Stroke Neurology following  -MRI Brain Pending  -rEEG pending  -Cont ASA 81mg  for now, if MRI negative okay to D  -Neuro checks  -SLP if memory/cog issues persist    #HLD  -cont statin.                 VTE Prophylaxis: enoxaparin (LOVENOX) syringe 40 mg  Place sequential compression device     Foley Catheter: No Foley Present    Venous Access: No Temporary Central Line Present    Medical Readiness for Discharge: Anticipated Tomorrow    Open Handoff Activity in Sidebar    Additional Diagnoses:               Laboratory Evidence of Acidosis:  Recent Labs   Lab 08/02/24  1124   CO2 21        Acidosis Diagnosis: No additional diagnosis        HPI:        83 year old right-handed female with a past medical history of hyperlipidemia presented on 08/02/2024 with acute onset short-term memory loss. Per her husband, the patient was at her cognitive baseline the night prior and went to bed around 11:00 PM. She awoke at approximately 7:30 AM with noticeable confusion and disorientation to time and situation. She was unable to recall recent significant events, including a physical therapy appointment that morning, a car purchase one week ago, and hip surgery in August. She also could not state the current month or her home address.  Her last known well time was 11:00 PM the previous night. There were no associated focal neurological deficits noted by her husband or on ED triage exam--no facial droop, dysarthria, weakness, or gait abnormalities. She was ambulatory on arrival, with equal grip strength bilaterally and no arm drift. Stroke team was activated in triage  due to concern for acute neurological change.  On evaluation, the patient remained amnestic to recent events and did not recall interacting with the stroke team 15 minutes after initial contact. Some improvement in memory was noted later, as she was eventually able to recall the recent car purchase, though she continued to have no recollection of the morning's events. She denied any current symptoms and reported feeling well. Review of systems was negative for fever, chills, headache, chest pain, shortness of breath, nausea, vomiting, or diarrhea. She has no prior history of stroke or TIA. Blood glucose on arrival was 134 mg/dL.         Past Medical / Surgical / Family / Social History:     Medical History[1]  Past Surgical History[2]   Family History[3]  Social History[4]    Allergies and Home Medications:     Allergies[5]  Home Medications               acetaminophen  (TYLENOL ) 500 MG tablet     Take 2 tablets by mouth as needed     BIOTIN PO     Take 1,000 mg by mouth daily        cyclobenzaprine  (FLEXERIL ) 5 MG tablet  Take 1 tablet (5 mg total) by mouth 3 (three) times daily as needed for Muscle spasms     cycloSPORINE (RESTASIS) 0.05 % ophthalmic emulsion     Place 1 drop into both eyes 2 (two) times daily     Estradiol -Norethindrone  Acet 0.5-0.1 MG per tablet     Take by mouth daily     famotidine  (PEPCID ) 20 MG tablet     Take 20 mg by mouth 2 (two) times daily     gabapentin  (NEURONTIN ) 300 MG capsule     Take 1 capsule (300 mg total) by mouth every 8 (eight) hours     simvastatin (ZOCOR) 20 MG tablet     Take 20 mg by mouth daily               Review of Systems:   All other systems were reviewed and negative except as described above.        Objective:      Patient Vitals for the past 24 hrs:   BP Temp Temp src Pulse Resp SpO2 Height Weight   08/02/24 1159 169/86 -- -- 89 -- 99 % -- --   08/02/24 1131 (!) 165/92 -- -- 90 -- 98 % -- --   08/02/24 1102 149/70 98 F (36.7 C) Temporal 92 18 99 % 1.6 m (5'  3) 63.5 kg (140 lb)   08/02/24 1100 -- -- -- (!) 106 -- 99 % -- --        General: awake, alert, oriented to self and hospital (not year); no acute distress  HEENT: anicteric sclerae, PERRL, EOMI; MMM  Cardiovascular: regular rate and rhythm; no murmurs, rubs, or gallops  Lungs: clear to auscultation bilaterally without wheezing, rhonchi, or rales  Abdomen: soft, non-distended; non-tender to palpation, no rebound or guarding  Extremities: warm; no LE edema; no clubbing or cyanosis  Neuro: symmetric facial movements, clear speech, moving all extremities          Elicia Rav MD MBA            [1]   Past Medical History:  Diagnosis Date    Abnormal Pap smear     >20 years ago, colpo, NL since    Dry eyes     drops daily    Hyperlipidemia     controlled w/ medication    Low back pain     Osteoarthritis     back, knees, hands    Pelvic mass in female 07/05/2014    Pelvic MRI 2015 Stable exam. 2. Simple cyst in the left mesorectal fat. 3. Proteinaceous cyst in the lower vagina representing a cyst of either Skenes duct or Bartholins gland duct. 4. Submucosal fibroid.    [2]   Past Surgical History:  Procedure Laterality Date    BREAST SURGERY  01/04/2009    Left Stereotactic Biopsy     BUNIONECTOMY Left 06/2016    COLONOSCOPY, DIAGNOSTIC (SCREENING)  2014    COLPOSCOPY      >20 years ago     DILATION AND CURETTAGE OF UTERUS  10/2013    HIP SURGERY  05/2024    JOINT REPLACEMENT Right 06/2017    knee    LAMINECTOMY, POSTERIOR LUMBAR, DECOMPRESSION, FUSION LEVEL 2 N/A 09/21/2018    Procedure: LAMINECTOMY, POSTERIOR LUMBAR, DECOMPRESSION, FUSION LEVEL 2;  Surgeon: Karolynn Tanda BROCKS, MD;  Location: [facility];  Service: Neurosurgery;  Laterality: N/A;  L4-S1 LAMINECTOMY AND FUSION   [3]   Family History  Problem  Relation Name Age of Onset    Skin cancer Maternal Grandfather      Breast cancer Neg Hx      Ovarian cancer Neg Hx      Cancer Neg Hx     [4]   Social History  Tobacco Use    Smoking status: Former     Current  packs/day: 0.00     Average packs/day: 2.0 packs/day for 20.0 years (40.0 ttl pk-yrs)     Types: Cigarettes     Start date: 72     Quit date: 1989     Years since quitting: 36.8    Smokeless tobacco: Never   Vaping Use    Vaping status: Never Used   Substance Use Topics    Alcohol use: Yes     Alcohol/week: 7.0 standard drinks of alcohol     Types: 7 Glasses of wine per week    Drug use: No   [5] No Known Allergies

## 2024-08-02 NOTE — Plan of Care (Signed)
 Stroke Neurology Plan of Care Note    MRI reviewed by stroke team, negative for acute ischemic stroke. 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 No further stroke workup warranted. Stroke team signing off at this time, please call with any further questions or concerns. Case discussed with MD Josepha.     Alan Verneda ELNITA GLENWOOD [facility]  Stroke Neurology  [phone]

## 2024-08-02 NOTE — Consults (Signed)
 [facility]  ACUTE STROKE ALERT NOTE          Reason for Consultation: Acute Stroke Alert           History of Present Illness       Carolyn [name] is a 83 y.o. right handed female, with medical history of HLD who presented on 08/02/2024 with short term memory loss. Their Last known well time was 0830. Per husband, patient seemed at baseline last night, perhaps had some mild dysarthria last night after returning from playing cards with friends. He did not note dysarthria this morning when they woke up. At 0830, she had acute onset confusion -- did not recall having a PT appointment this morning, could not tell him the month, did not recall buying a car last week or having hip surgery in August. He did not note any weakness, difficulty speaking, dysarthria, facial droop or gait changes during this period. No hx of similar. On arrival, still with impaired memory; spoke with stroke team in ED triage and did not recall ever meeting the team 15 minutes later after [name] had been obtained. Some improvement with memory, does now recall buying car last week but otherwise cannot recall events of this morning what-so-ever.      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 female with memory impairment. Imaging studies revealed no acute hemorrhage or large territory infarct. Not a candidate for IV thrombolytics given rapid improvement, non-focal deficits/non-disabling deficits.      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  - Check Lipids, A1C  - TTE wo bubble if MRI+ for AIS  - Continuous cardiac monitoring  - STAT NCHCT for acute neuro change        Stroke testing with stroke team interpretation:      - NCHCT: No acute hemorrhage, no large territory infarct  - MRI Brain: Pending  - TTE: Pending      Stroke Labs:   No results found for: CHOL, HDL, LDL, TRIG, HGBA1C       Objective      Neurological Examination     Most Recent Vital Signs:  Temp: 98 F (36.7 C) BP: 149/70 Heart Rate: 92 Resp Rate: 18 SpO2: 99 % on   None (Room air)    GENERAL: female in NAD who appears as stated age. Well nourished and developed. Cooperative with exam.  HEENT: Normocephalic, atraumatic, mucous membranes moist.  CV: Warm and well perfused.  No clubbing cyanosis or edema.  Pulmonary: No SOB, no resp distress.  Extremities/Skin: No rashes or bruising.  Psych: Does not appear anxious or agitated.    NEUROLOGICAL:  MENTATION: Awake, alert, oriented to person. Initially  incorrect month, answered correctly on re-assessment, incorrect age. Impaired short term memory.   LANGUAGE/SPEECH: Conversant speech. Intact fluency, comprehension, repetition, and naming. No dysarthria appreciated    CRANIAL NERVES:  II, III, IV, VI:  Pupils equal and reactive to light. Visual fields grossly full. Extra-ocular motion intact.  V: Facial Sensation is intact to light touch and symmetric.  VII: Facial movement is symmetric.  VIII: Hearing grossly intact bilaterally.  IX: No drooling.  X: Voice full.  XI: Turns head bilaterally.  XII: Tongue protrudes midline.    MOTOR/STRENGTH:  Muscle tone normal without spasticity or flaccidity. No atrophy. Moving all extremities spontaneously against gravity. 5/5 throughout.     SENSORY: Grossly intact to light touch bilaterally.    COORDINATION: FTN and HKS intact bilaterally, no truncal  ataxia. No tremors.    GAIT: Deferred due to safety risks          Stroke Presentation Data     Stroke Presentation Data:      Presentation Data - 08/02/24 1151       Patient Arrival Time (ED only) 1100     Patient Arrival Date (ED only) 08/02/24     Stroke Alert Activation (Date): 08/02/24     Stroke Team Response (Date): 08/02/24     Discovery of Stroke Symptoms (Time): --     Discovery of Stroke Symptoms (Date): --     Last Known Well - Time 0830     Last Known Well - Date 08/02/24     mRS Assessment Source Patient     mRS Value 0 - No symptoms     Time of Discovery (inpatient only) --     Initial Blood Glucose < 50 or >400 No     Weight --     Weight Method --     POCT Glucose Result (Read Only) --              Initial Vital Signs:  BP:149/70 (08/02/24 1102)  Pulse:(!) 106 (08/02/24 1100)  Resp:18 (08/02/24 1102)  Temp:98 F (36.7 C) (08/02/24 1102)  SpO2:99 % (08/02/24 1100)     CT (Non-Contrast) Wet Read Time of wet read: 1123  CT Findings: No hemorrhage, no large territory infarct   CTA Wet Read Time of wet read:    CTA Findings:       Diagnostic imaging results above were reviewed by stroke attending at the time of wet read.    NIHSS Stroke Scale:      NIHSS - 08/02/24 1115       Interval Baseline admission to ED     1a. Level of Consciousness 0     1b. LOC Questions (age, month) 2     1c. LOC Commands (Open and close eyes, Grip AND release good hand) 0     2. Best Gaze 0     3. Visual Fields 0     4. Facial Palsy 0     5a. Motor Left Arm: (Arms with palm down X 10 seconds. Sitting = arms at 90 degrees. Supine = arms 45 degrees) 0     5b. Motor Right Arm: (Arms with palm down X 10 seconds. Sitting = arms at 90 degrees Supine = arms 45 degrees) 0     Total Motor Arms 0     6a. Motor Left Leg: (Leg elevated X 5 seconds Supine = Leg 30 degrees) 0     6b. Motor Right Leg: (Leg elevated X 5 seconds Supine =  Leg 30 degrees) 0     Total Motor Legs 0     7. Limb Ataxia (Finger to nose, heel to shin) 0     8.  Sensory (Sensation or grimace to pin prick on face, arm, trunk, and leg) 0     9. Best language (Describe picture, name items, read sentences from NIHSS booklet) 0     10. Dysarthria (Read list from NIHSS booklet) 0     11. Extinction and Inattention (formerly Neglect) - Test tactile and visual stimulation 0     NIHSS Total 2                IV Thrombolytic Administration:      IV Thrombolytic Administration - 08/02/24 1155       IV thrombolytic administered? No     Delay in Door to Treatment? No     Reason for no thrombolytic administration Rapid improvement on NIHSS to 0 with no measurable deficit               Recent Thrombolytic Administrations (last 72 hours)       None          Thrombectomy:      Thrombectomy - 08/02/24 1155       Endovascular thrombectomy indicated?  No     Reason for No Thrombectomy No LVO              Toast Criterion:     NIHSS Total: 0   Baseline mRS:          Care Time     Thank you for allowing us  to participate in the care of Carolyn [name].    Case was discussed with the stroke attending, Dr. Maree, who agrees with plan of care above.    Please contact us  with any questions at any time.    I have personally assessed the patient and based my assessment and medical decision-making on a review of the patient's history and 24-hour interval events along with medical records, physical examination, vital signs, analysis of recent laboratory results, evaluation of radiology images, and monitoring data. The findings and plan of care was discussed with the care team.    Care time: 30 minutes    Vernell LELON Dolin, FNP    STROKE ATTENDING ADDENDUM:    As the physician signing this note, I have reviewed the history and exam and pertinent imaging results. My edits revise, addend or confirm the recommendations or plans in this note. I discussed with the plan of care with the APP/Resident and I agree with the findings noted unless revised or updated in my notations. I formulated all of the medical  decision making noted in the assessment & plan.    Dorcus Riga A. Maree, M.D.  Vascular Neurology Attending  Adrian Bidding  MD time 12 mins

## 2024-08-02 NOTE — Procedures (Signed)
 [facility]    Electroencephalogram Report    Date of Study: 08/02/2024  Patient's Name: Carolyn [name], Carolyn [name]  Date of Birth: Apr 25, 1941  MRN: [id number]    Clinical information: 82 year old lady with seizure-like activity    Medications:  Medications Ordered Prior to Encounter[1]    EEG description:  The Electroencephalogram is recorded on an 18-channel digital machine according to the guidelines of the American Clinical Neurophysiology Society using the International 10-20 system of electrode placement. Both referential and bipolar montages are performed.    The EEG is recorded with the patient awake and drowsy. The waking background activity in the occipital lobe consists of fairly well developed medium voltage 12hz  activity that attenuates with eye opening. Some eye movement artifact and low voltage fast activity is seen frontally.   There are no paroxysmal or epileptiform discharges.    Drowsiness is achieved with generalized slowing of the background rhythm.   Sleep architecture like K complexes and sleep spindles are not seen on the recording.  Intermittent photic stimulation produced a bilaterally symmetric driving response. No interhemispheric asymmetries were noted.  Hyperventilation is performed with good effort, producing generalized slowing of the background rhythm.   The limited EKG rhythm strip showed a regular rhythm.     Interpretation:     This routine Electroencephalogram recorded during wakefulness and drowsiness is within normal limits for age.   There is no evidence of epileptiform activity or focal slowing.   Please, note that absence of epileptiform activity does not rule out epilepsy.   Clinical correlation is therefore recommended.      Trudee Capers, MD  Board-certified in Adult Neurology and Clinical Neurophysiology                [1]   No current facility-administered medications on file prior to encounter.     Current Outpatient Medications on File Prior to Encounter    Medication Sig Dispense Refill    acetaminophen  (TYLENOL ) 500 MG tablet Take 2 tablets (1,000 mg) by mouth as needed      BIOTIN PO Take 1,000 mg by mouth once daily      Cyanocobalamin (B-12 PO) Take by mouth      cycloSPORINE (RESTASIS) 0.05 % ophthalmic emulsion Place 1 drop into both eyes once daily      Estradiol -Norethindrone  Acet 0.5-0.1 MG per tablet Take by mouth once daily      MAGNESIUM  PO Take 1 tablet by mouth once daily      rosuvastatin (CRESTOR) 40 MG tablet Take 1 tablet (40 mg) by mouth once daily      vitamin D (CHOLECALCIFEROL) 25 MCG (1000 UT) tablet Take 2 tablets (2,000 Units) by mouth once daily      cyclobenzaprine  (FLEXERIL ) 5 MG tablet Take 1 tablet (5 mg total) by mouth 3 (three) times daily as needed for Muscle spasms (Patient not taking: Reported on 08/02/2024) 30 tablet 0    famotidine  (PEPCID ) 20 MG tablet Take 20 mg by mouth 2 (two) times daily (Patient not taking: Reported on 08/02/2024)      gabapentin  (NEURONTIN ) 300 MG capsule Take 1 capsule (300 mg total) by mouth every 8 (eight) hours (Patient not taking: Reported on 08/02/2024) 60 capsule 0    simvastatin (ZOCOR) 20 MG tablet Take 20 mg by mouth daily    (Patient not taking: Reported on 08/02/2024)  3

## 2024-08-02 NOTE — ED Provider Notes (Addendum)
 [facility]  Emergency Department Physician Note      Diagnosis/Disposition     ED Disposition:  Observation    ED Diagnosis:  TGA (transient global amnesia)    Discharge Prescription    No medications on file         History of Present Illness      Nursing Triage Note: Pt ambulatory to triage c/o short term memory loss. Per pt's husband, pt is unable to recall that she had hip surgery in august, that she bought a new car last week, and other recent events. Pt disoriented to time and situation, typically A&Ox4 at baseline per husband. Pt went to sleep at baseline last night around 2300, woke up this AM around 0700 w/ the confusion. No facial droop noted at triage, grip strength equal bilaterally, no arm drift.  Chief Complaint: Memory Loss    83yo F with Pmhx of HLD who presents with husband for forgetfulness.     Stroke was called in triage.  Per patient, has been significantly more forgetful this morning than her baseline.  She was unable to remember her address, that she bought a new car 1 week prior, or other recent big life events.  Per family, patient AO x 4 baseline.    Last known normal was around 11 PM last night. BG on arrival 134.    Patient denies any history of TIA or strokes.    Patient denies any symptoms at this time endorsing she feels well.  Denies any fever/chills, nausea/vomiting/diarrhea, headache, chest pain, shortness of breath.    Physical Exam     Pulse (!) 106  BP 149/70  Resp 18  SpO2 99 %  Temp 98 F (36.7 C)     Physical Exam  Constitutional:       General: She is not in acute distress.     Appearance: Normal appearance. She is normal weight. She is not ill-appearing or toxic-appearing.   HENT:      Head: Normocephalic and atraumatic.      Nose: Nose normal. No congestion.      Mouth/Throat:      Mouth: Mucous membranes are dry.   Eyes:      Extraocular Movements: Extraocular movements intact.      Pupils: Pupils are equal, round, and reactive to light.    Cardiovascular:      Rate and Rhythm: Normal rate and regular rhythm.   Pulmonary:      Effort: Pulmonary effort is normal. No respiratory distress.   Abdominal:      General: Abdomen is flat. There is no distension.      Palpations: Abdomen is soft.      Tenderness: There is no abdominal tenderness.   Musculoskeletal:      Right lower leg: No edema.      Left lower leg: No edema.   Skin:     General: Skin is warm.      Capillary Refill: Capillary refill takes less than 2 seconds.      Coloration: Skin is not jaundiced.      Findings: No bruising.   Neurological:      General: No focal deficit present.      Mental Status: She is alert and oriented to person, place, and time.      Cranial Nerves: No cranial nerve deficit.      Motor: No weakness.   Psychiatric:         Mood and Affect: Mood normal.  Behavior: Behavior normal.           Medical Decision Making        Medical Decision Making:  83 y.o. female with Hx as above presenting with AMS.    Patient was made stroke alert from triage.  NIH stroke scale of 0.  Of note, after meeting the patient in triage, she did not recognize me or the stroke team that was also bedside while in triage after she had her CT head done.  Symptoms concerning for potential TIA.  Stroke team did not appreciate any abnormalities on CT imaging.  They recommend admission for MRI.    Patient denies any infectious symptoms.  No significant leukocytosis or neutrophil predominance on blood work to suggest infectious etiology.  COVID/flu negative. Chest x-ray without evidence of pneumonia.  Urinalysis pending.    Metabolic workup reassuring.    Patient without endorses significant external bleeding.  No anemia on today's blood work.    Given patient's age, reported symptoms, witnessed episode of forgetfulness, concern for potential TGA.  Discussed with CNS team who is amenable with admission.    Dispo: The patient was admitted and handed off to Dr. Sable. We discussed all aspects of  the work-up that had been completed in the ED, any pending studies/therapies, and all clinical decision making at the time care was handed off.     ED Course as of 08/02/24 1233   Tue Aug 02, 2024   1119 Whole Blood Glucose POCT(!): 134  No hypoglycemia [AA]   1119 Temp: 98 F (36.7 C)  Afebrile [AA]   1119 Heart Rate: 92 [AA]   1119 Resp Rate: 18 [AA]   1119 SpO2: 99 % [AA]   1119 BP: 149/70 [AA]   1131 Stroke evaluated pt, recommend admission for MRI [AA]   1141 WBC: 8.69  No significant leukocytosis with only mild neutrophil predominance.  Patient denying any infectious symptoms.  No concern for systemic infection at this time. [AA]   1143 Hemoglobin: 13.3  No anemia [AA]   1143 Platelet Count: 235 [AA]   1144 CT Head WO Contrast  1. No CT evidence of acute intracranial hemorrhage, herniation or hydrocephalus.       2. Cerebral volume loss and sequela of chronic ischemic disease. [AA]   1153 Magnesium : 2.2 [AA]   1153 Phosphorus: 3.1 [AA]   1153 Creatinine: 0.9 [AA]   1153 Sodium: 141 [AA]   1153 Potassium: 4.2 [AA]   1153 Chloride: 110 [AA]   1153 Calcium: 9.4  Electrolytes grossly within normal limits [AA]   1153 AST: 30 [AA]   1154 ALT: 22 [AA]   1154 Alkaline Phosphatase: 63  LFTs WNL [AA]   1154 XR Chest  AP Portable  No acute cardiopulmonary abnormality. [AA]   1219 SARS-CoV-2 (COVID-19) RNA: Not Detected [AA]   1219 Influenza A RNA: Not Detected [AA]   1219 Influenza B RNA: Not Detected [AA]   1219 TSH: 1.83  WNL [AA]      ED Course User Index  [AA] Minna Rosine BRAVO, MD       Medical Decision Making  Amount and/or Complexity of Data Reviewed  Labs: ordered. Decision-making details documented in ED Course.  Radiology: ordered. Decision-making details documented in ED Course.    Risk  OTC drugs.  Decision regarding hospitalization.    Records Reviewed (internal and external)? : prior lab results reviewed for comparison and records from outside facilities found in [facility]  Was management discussed  with a consultant? : I discussed management with Stroke Team. The patient's condition and all pertinent labs and/or radiology studies discussed. The consultant recommended admission.   Diagnostic test considered and not performed : N/A  Prescription medications considered and not given : N/A  Hospitalization considered but not done : N/A  Was the decision around the need for surgery discussed with a consultant? : N/A  Social Determinants of Health Considerations : N/A  Was there decision to not resuscitate or to de-escalate care due to poor prognosis? : N/A        Interpretations   O2 Sat:  The patient's oxygen saturation was 99 % on room air. This was independently interpreted by me as Normal.       EKG: I reviewed and independently interpreted the patient's EKG as: Normal sinus with rate of 94.  T wave inversion in V1 and lead III.  Appropriate QT.  No STEMI.      Radiology: All available radiologist's interpretations were reviewed if not separately interpreted by me.  NIH Stroke Score      Flowsheet Row Most Recent Value   NIH Stroke Scale    Interval Baseline admission to ED   1a. Level of Consciousness 0   1b. LOC Questions (age, month) 0   1c. LOC Commands (Open and close eyes, Grip AND release good hand) 0   2. Best Gaze 0   3. Visual Fields 0   4. Facial Palsy 0   5a. Motor Left Arm: (Arms with palm down X 10 seconds. Sitting = arms at 90 degrees. Supine = arms 45 degrees) 0   5b. Motor Right Arm: (Arms with palm down X 10 seconds. Sitting = arms at 90 degrees Supine = arms 45 degrees) 0   Total Motor Arms 0   6a. Motor Left Leg: (Leg elevated X 5 seconds Supine = Leg 30 degrees) 0   6b. Motor Right Leg: (Leg elevated X 5 seconds Supine = Leg 30 degrees) 0   Total Motor Legs 0   7. Limb Ataxia (Finger to nose, heel to shin) 0   8. Sensory (Sensation or grimace to pin prick on face, arm, trunk, and leg) 0   9. Best language (Describe picture, name items, read sentences from NIHSS booklet) 0   10. Dysarthria  (Read list from NIHSS booklet) 0   11. Extinction and Inattention (formerly Neglect) - Test tactile and visual stimulation 0   NIHSS Total 0         Procedures      Procedures      Critical Care Time(not including procedures): 46 minutes.  Due to the high risk of critical illness or multi-organ failure at initial presentation and/or during ED course.   System(s) at risk for compromise:  CNS  Critical Diagnosis:   1. TGA (transient global amnesia)       The patient was Hypotensive:   No    The patient was Hypoxic:   No    This does not including time spent performing other reported procedures or services.  Critical care time involved full attention to the patient's condition and included:   Review of nursing notes and/or old charts - Yes  Documentation time - Yes  Care, transfer of care, and discharge plans - Yes  Obtaining necessary history from family, EMS, nursing home staff and/or treating physicians - Yes  Review of medications, allergies, and vital signs - Yes   Consultant collaboration on findings and treatment options -  Yes         Supplemental Encounter Data   Medical History[1]  Past Surgical History[2]  Social History[3]  Family History[4]  Allergies[5]    Medications Administered:  Medications   aspirin chewable tablet 162 mg (has no administration in time range)     Laboratory and Imaging Studies:  Results for orders placed or performed during the hospital encounter of 08/02/24 (from the past 24 hours)    Collection Time: 08/02/24 11:13 AM   Result Value    Whole Blood Glucose POCT 134 (H)   Comprehensive Metabolic Panel    Collection Time: 08/02/24 11:24 AM   Result Value    Glucose 122 (H)    BUN 14    Creatinine 0.9    Sodium 141    Potassium 4.2    Chloride 110    CO2 21    Calcium 9.4    Anion Gap 10.0    GFR >60.0    AST (SGOT) 30    ALT 22    Alkaline Phosphatase 63    Albumin 4.2    Protein, Total 6.8    Globulin 2.6    Albumin/Globulin Ratio 1.6    Bilirubin, Total 0.6    Collection Time:  08/02/24 11:24 AM   Result Value    Magnesium  2.2    Collection Time: 08/02/24 11:24 AM   Result Value    Phosphorus 3.1    Collection Time: 08/02/24 11:24 AM   Result Value    TSH 1.83   CBC with Differential (Component)    Collection Time: 08/02/24 11:24 AM   Result Value    WBC 8.69    Hemoglobin 13.3    Hematocrit 40.3    Platelet Count 235    MPV 11.7    RBC 4.31    MCV 93.5    MCH 30.9    MCHC 33.0    RDW 12    nRBC % 0.0    Absolute nRBC 0.00    Preliminary Absolute Neutrophil Count 6.65 (H)    Neutrophils % 76.5    Lymphocytes % 16.7    Monocytes % 5.1    Eosinophils % 0.7    Basophils % 0.7    Immature Granulocytes % 0.3    Absolute Neutrophils 6.65 (H)    Absolute Lymphocytes 1.45    Absolute Monocytes 0.44    Absolute Eosinophils 0.06    Absolute Basophils 0.06    Absolute Immature Granulocytes 0.03   COVID-19 and Influenza ([place]) (symptomatic)    Collection Time: 08/02/24 11:29 AM    Specimen: Anterior Nares; Swab   Result Value    SARS-CoV-2 (COVID-19) RNA Not Detected    Influenza A RNA Not Detected    Influenza B RNA Not Detected     XR Chest  AP Portable   Final Result         No acute cardiopulmonary abnormality.      [name], MD   08/02/2024 11:47 AM      CT Head WO Contrast   Final Result          1. No CT evidence of acute intracranial hemorrhage, herniation or   hydrocephalus.         2. Cerebral volume loss and sequela of chronic ischemic disease.      [name], MD   08/02/2024 11:28 AM          Attestations     I am the primary  attending physician of record for this patient.        Nimrit Kehres E, MD  08/02/24 1233         [1]   Past Medical History:  Diagnosis Date    Abnormal Pap smear     >20 years ago, colpo, NL since    Dry eyes     drops daily    Hyperlipidemia     controlled w/ medication    Low back pain     Osteoarthritis     back, knees, hands    Pelvic mass in female 07/05/2014    Pelvic MRI 2015 Stable exam. 2. Simple cyst in the left mesorectal fat. 3.  Proteinaceous cyst in the lower vagina representing a cyst of either Skenes duct or Bartholins gland duct. 4. Submucosal fibroid.    [2]   Past Surgical History:  Procedure Laterality Date    BREAST SURGERY  01/04/2009    Left Stereotactic Biopsy     BUNIONECTOMY Left 06/2016    COLONOSCOPY, DIAGNOSTIC (SCREENING)  2014    COLPOSCOPY      >20 years ago     DILATION AND CURETTAGE OF UTERUS  10/2013    HIP SURGERY  05/2024    JOINT REPLACEMENT Right 06/2017    knee    LAMINECTOMY, POSTERIOR LUMBAR, DECOMPRESSION, FUSION LEVEL 2 N/A 09/21/2018    Procedure: LAMINECTOMY, POSTERIOR LUMBAR, DECOMPRESSION, FUSION LEVEL 2;  Surgeon: Karolynn Tanda BROCKS, MD;  Location: [facility];  Service: Neurosurgery;  Laterality: N/A;  L4-S1 LAMINECTOMY AND FUSION   [3]   Social History  Tobacco Use    Smoking status: Former     Current packs/day: 0.00     Average packs/day: 2.0 packs/day for 20.0 years (40.0 ttl pk-yrs)     Types: Cigarettes     Start date: 46     Quit date: 1989     Years since quitting: 36.8    Smokeless tobacco: Never   Vaping Use    Vaping status: Never Used   Substance Use Topics    Alcohol use: Yes     Alcohol/week: 7.0 standard drinks of alcohol     Types: 7 Glasses of wine per week    Drug use: No   [4]   Family History  Problem Relation Name Age of Onset    Skin cancer Maternal Grandfather      Breast cancer Neg Hx      Ovarian cancer Neg Hx      Cancer Neg Hx     [5] No Known Allergies       Tiffine Henigan E, MD  08/02/24 1233

## 2024-08-02 NOTE — Plan of Care (Addendum)
 Adult Observation Progress Note     Neuro: Aox4, but forgetful. NIHSS 0.  Cardio: Denies chest pain.   Resp: RA, not in distress.  Integ: Non-pitting edema on BLE noted.  MSK: Standby assist. Mod falls risk.   GI: Denies abd pain or n/v.  GU: Continent of urine.   IV Access: 18g RAC.      BM during shift: no     Pending Orders: Neuro checks/NIHSS, symptom mgt, tele     Discharge Plan: pending medical clearance     Social/Family Visits: N/A     POC update: pt updated with POC during bedside report    Problem: Neurological Deficit  Goal: Neurological status is stable or improving  Outcome: Progressing  Flowsheets (Taken 08/02/2024 2008)  Neurological status is stable or improving:   Observe for seizure activity and initiate seizure precautions if indicated   Monitor/assess/document neurological assessment (Stroke: every 4 hours)   Monitor/assess NIH Stroke Scale   Perform CAM Assessment   Re-assess NIH Stroke Scale for any change in status     Problem: Potential for Aspiration  Goal: Risk of aspiration will be minimized  Outcome: Progressing  Flowsheets (Taken 08/02/2024 2008)  Risk of aspiration will be minimized:   Instruct patient to take small bites, small single sips of liquid, and do not use a straw   Place patient up in chair to eat, if possible/head of bed up 90 degrees to eat if unable to be out of bed   Monitor/assess for signs of aspiration (tachypnea, cough, wheezing, clearing throat, hoarseness after eating, decrease in SaO2)     Problem: Peripheral Neurovascular Impairment  Goal: Extremity color, movement, sensation are maintained or improved  Outcome: Progressing  Flowsheets (Taken 08/02/2024 2008)  Extremity color, movement, sensation are maintained or improved: Assess extremity for proper alignment     Problem: Impaired Mobility  Goal: Mobility/Activity is maintained at optimal level for patient  Outcome: Progressing  Flowsheets (Taken 08/02/2024 2008)  Mobility/activity is maintained at optimal level  for patient:   Encourage independent activity per ability   Consult/collaborate with Physical Therapy and/or Occupational Therapy     Problem: Communication Impairment  Goal: Will be able to express needs and understand communication  Outcome: Progressing  Flowsheets (Taken 08/02/2024 2008)  Able to express needs and understand communication:   Provide alternative method of communication if needed   Patient/patient care companion demonstrates understanding on disease process, treatment plan, medications and discharge plan   Include patient care companion in decisions related to communication     Problem: Day of Admission - Stroke  Goal: Core/Quality measure requirements - Admission  Outcome: Progressing  Flowsheets (Taken 08/02/2024 2008)  Core/Quality measure requirements - Admission:   Document NIH Stroke Scale on admission   Document nursing swallow/dysphagia screen on admission. If patient fails, keep patient NPO (follow your hospital protocol on swallowing screening).   VTE Prevention: Ensure anticoagulant(s) administered and/or anti-embolism stockings/devices documented as ordered   Ensure antithrombotic administered or contraindication documented by LIP   If diagnosis or history of Atrial Fib/Atrial Flutter, ensure oral anticoagulation is initiated or contraindication documented by LIP   Ensure PT/OT and/or SLP ordered   Begin stroke education on admission (must include Modifiable Risk Factors, Warning Signs and Symptoms of Stroke, Activation of Emergency Medical System and Follow-up Appointments) Ensure handout has been given and documented.     Problem: Every Day - Stroke  Goal: Neurological status is stable or improving  Outcome: Progressing  Flowsheets (Taken  08/02/2024 2008)  Neurological status is stable or improving:   Observe for seizure activity and initiate seizure precautions if indicated   Monitor/assess/document neurological assessment (Stroke: every 4 hours)   Monitor/assess NIH Stroke Scale    Perform CAM Assessment   Re-assess NIH Stroke Scale for any change in status  Goal: Mobility/Activity is maintained at optimal level for patient  Outcome: Progressing  Flowsheets (Taken 08/02/2024 2008)  Mobility/activity is maintained at optimal level for patient:   Encourage independent activity per ability   Consult/collaborate with Physical Therapy and/or Occupational Therapy  Goal: Core/Quality measure requirements - Daily  Outcome: Progressing  Flowsheets (Taken 08/02/2024 2008)  Core/Quality measure requirements - Daily:   Ensure antithrombotic administered or contraindication documented by LIP by end of day 2   VTE Prevention: Ensure anticoagulant(s) administered and/or anti-embolism stockings/devices documented by end of day 2   Once lipid panel has resulted, check LDL. Contact provider for statin order if LDL > 70 (or ensure contraindication documented by LIP).   Continue stroke education (must include Modifiable Risk Factors, Warning Signs and Symptoms of Stroke, Activation of Emergency Medical System and Follow-up Appointments). Ensure handout has been given and documented.  Goal: Stable vital signs and fluid balance  Outcome: Progressing  Flowsheets (Taken 08/02/2024 2008)  Stable vital signs and fluid balance:   Monitor intake and output. Notify LIP if urine output is < 30 mL/hour.   Position patient for maximum circulation/cardiac output   Monitor and assess vitals every 4 hours or as ordered and hemodynamic parameters   Encourage oral fluid intake   Apply telemetry monitor as ordered  Goal: Patient will maintain adequate oxygenation  Outcome: Progressing  Flowsheets (Taken 08/02/2024 2008)  Patient will maintain adequate oxygenation: Maintain SpO2 of greater than 92%  Goal: Patient's risk of aspiration will be minimized  Outcome: Progressing  Flowsheets (Taken 08/02/2024 2008)  Patient's risk of aspiration will be minimized:   Monitor/assess for signs of aspiration (tachypnea, cough, wheezing,  clearing throat, hoarseness after eating, decrease in SaO2   Thicken liquids as recommended by Speech Pathologist   Assess and monitor ability to swallow   Place patient up in chair to eat, if possible   Instruct patient to take small bites   Instruct patient to take small single sips of liquid   Keep head of bed up a minimum of 30 degrees when hemodynamically stable  Goal: Nutritional intake is adequate  Outcome: Progressing  Goal: Elimination patterns are normal or improving  Outcome: Progressing  Goal: Skin integrity is maintained or improved  Outcome: Progressing  Flowsheets (Taken 08/02/2024 2008)  Skin integrity is maintained or improved:   Increase activity as tolerated/progressive mobility   Assess Braden Scale every shift   Keep skin clean and dry   Monitor patient's hygiene practices   Utilize specialty bed   Keep head of bed 30 degrees or less (unless contraindicated)   Encourage use of lotion/moisturizer on skin  Goal: Neurovascular status is stable or improving  Outcome: Progressing  Flowsheets (Taken 08/02/2024 2008)  Neurovascular status is stable or improving:   Monitor/assess neurovascular status (pulses, capillary refill, pain, paresthesia, presence of edema)   Monitor/assess for signs of Venous Thrombus Emboli (edema of calf/thigh redness, pain)   Monitor/assess site of invasive procedure for signs of bleeding  Goal: Effective coping demonstrated  Outcome: Progressing  Flowsheets (Taken 08/02/2024 2008)  Effective coping demonstrated:   Assess/report to LIP uncontrolled anxiety, depression, or ineffective coping   Offer reassurance to decrease  anxiety     Problem: Moderate/High Fall Risk Score >5  Goal: Patient will remain free of falls  Outcome: Progressing  Flowsheets (Taken 08/02/2024 1930)  Moderate Risk (6-13):   MOD-Consider activation of bed alarm if appropriate   MOD-Floor mat at bedside (where available) if appropriate   MOD-Remain with patient during toileting   MOD-Perform dangle,  stand, walk (DSW) prior to mobilization

## 2024-08-02 NOTE — ED Notes (Signed)
 Bed: N 29  Expected date:   Expected time:   Means of arrival:   Comments:  8G HERE

## 2024-08-02 NOTE — Plan of Care (Incomplete)
 Short-term memory loss    On ED arrival, VSS.  Labs notable for: grossly unremarkable CBC, CMP, Mg, Phos, TSH, COVID/flu, and UA.  EKG showed NSR with sinus arrhythmia.  CT head showed no acute abnormality.  CXR showed no acute process.  A stroke alert was called in the ED and she was evaluated by the stroke team who suspects TGA, recommended MRI brain to r/o stroke and routine EEG.  She was admitted to observation.  She was started on empiric ASA.    MRI brain showed ***  Routine EEG showed ***  ***    # Transient memory impairment, ***  # HLD  # OA

## 2024-08-03 LAB — URINE DRUGS OF ABUSE SCREEN
Urine Amphetamine Screen: NEGATIVE
Urine Barbituate Screen: NEGATIVE
Urine Benzodiazepine Screen: NEGATIVE
Urine Cannabinoid Screen: NEGATIVE
Urine Cocaine Screen: NEGATIVE
Urine Fentanyl Screen: NEGATIVE
Urine Opiate Screen: NEGATIVE
Urine PCP Screen: NEGATIVE

## 2024-08-03 LAB — LIPID PANEL
Cholesterol / HDL Ratio: 2.6 {index}
Cholesterol: 164 mg/dL (ref <=199)
HDL: 64 mg/dL (ref >=40)
LDL Calculated: 82 mg/dL (ref 0–99)
Triglycerides: 99 mg/dL (ref 34–149)
VLDL Calculated: 15 mg/dL (ref 10–40)

## 2024-08-03 LAB — CBC
Absolute nRBC: 0 x10 3/uL (ref <=0.00)
Hematocrit: 36.5 % (ref 34.7–43.7)
Hemoglobin: 11.9 g/dL (ref 11.4–14.8)
MCH: 30.4 pg (ref 25.1–33.5)
MCHC: 32.6 g/dL (ref 31.5–35.8)
MCV: 93.4 fL (ref 78.0–96.0)
MPV: 12.4 fL (ref 8.9–12.5)
Platelet Count: 214 x10 3/uL (ref 142–346)
RBC: 3.91 x10 6/uL (ref 3.90–5.10)
RDW: 13 % (ref 11–15)
WBC: 6.68 x10 3/uL (ref 3.10–9.50)
nRBC %: 0 /100{WBCs} (ref <=0.0)

## 2024-08-03 LAB — PT/INR
INR: 1.1 (ref 0.9–1.1)
PT: 12.9 s (ref 10.1–12.9)

## 2024-08-03 LAB — RENAL FUNCTION PANEL
Albumin: 3.6 g/dL (ref 3.5–4.9)
Anion Gap: 8 (ref 5.0–15.0)
BUN: 16 mg/dL (ref 7–21)
CO2: 23 meq/L (ref 17–29)
Calcium: 9.1 mg/dL (ref 7.9–10.2)
Chloride: 110 meq/L (ref 99–111)
Creatinine: 0.9 mg/dL (ref 0.4–1.0)
GFR: >60 mL/min/1.73 m2 (ref >=60.0)
Glucose: 90 mg/dL (ref 70–100)
Phosphorus: 4.3 mg/dL (ref 2.3–4.7)
Potassium: 3.9 meq/L (ref 3.5–5.3)
Sodium: 141 meq/L (ref 135–145)

## 2024-08-03 LAB — HEMOGLOBIN A1C
Average Estimated Glucose: 108.3 mg/dL
Hemoglobin A1C: 5.4 % (ref 4.6–5.6)

## 2024-08-03 LAB — THYROID STIMULATING HORMONE (TSH) WITH REFLEX TO FREE T4: TSH: 3.46 u[IU]/mL (ref 0.35–4.94)

## 2024-08-03 LAB — APTT: PTT: 29 s (ref 27–39)

## 2024-08-03 NOTE — Discharge Summary -  Nursing (Signed)
 ADULT OBSERVATION UNIT  DISCHARGE NOTE      Date Time: 08/03/24 11:39 AM  Patient Name: Carolyn [name]  Attending Physician: No att. providers found      Date of Admission:   08/02/2024        Discharge Instructions:        Patient stable for discharge. Discharge instructions given, patient teachings done and verbalized understanding. All questions answered at this time. Aware to follow-up and schedule appointments as necessary. IV access discontinued. Discharge to home with husband.     Patient aware to follow-up with MD in 1 week.    Signed by: [address], RN

## 2024-08-03 NOTE — Discharge Instr - AVS First Page (Addendum)
 Reason for your Hospital Admission:  Memory loss, resolved    Instructions for after your discharge:  -- Schedule a follow up visit with your primary care provider - within 1 week of discharge from the hospital.  -- Resume your regular home medications, no changes were made.  -- You may resume your previous diet and activity as tolerated.  -- Return to the nearest emergency department for sudden one sided numbness and/or weakness, severe headache, new confusion, trouble speaking, difficulty walking, facial droop, or any other concerning symptoms

## 2024-08-03 NOTE — Progress Notes (Signed)
 08/03/24 0944   Case Management Quick Doc   Acknowledgment of Outpatient/Observation Observation letter given   CMA Tasks   CMA tasks MOON delivered     Pt reviewed and signed in mychart.

## 2024-08-03 NOTE — Discharge Summary (Signed)
 [facility]  Internal Medicine Hospitalists  Discharge Summary          CNS PROGRESS NOTE/DISCHARGE SUMMARY  CONDITIONAL DISCHARGE: No. 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  # HLD  # OA      Disposition: Home     Discharge MEDICATIONS        Medication List        CONTINUE taking these medications      B-12 PO     BIOTIN PO     cycloSPORINE 0.05 % ophthalmic emulsion  Commonly known as: RESTASIS     Estradiol -Norethindrone  Acet 0.5-0.1 MG per tablet     MAGNESIUM  PO     rosuvastatin 40 MG tablet  Commonly known as: CRESTOR     TYLENOL  500 MG tablet  Generic drug: acetaminophen      vitamin D 25 MCG (1000 UT) tablet  Commonly known as: CHOLECALCIFEROL            Consultations:   Treatment Team:   Craven Setters, MD  Boucaud, Shanda GORMAN Terese Asberry LITTIE, FNP  Scozzari, Jaclyn, SLP  Chi, Merilee, RN  Vicci Lamprey, RN   Recent Labs:   Recent Labs   Lab 08/03/24  0411   WBC 6.68   Hemoglobin 11.9   Hematocrit 36.5   Platelet Count 214     Recent Labs   Lab 08/03/24  0411   Sodium 141   Potassium 3.9   Chloride 110   CO2 23   BUN 16   Creatinine 0.9   GFR >60.0   Glucose 90   Calcium 9.1         Recent Labs   Lab 08/03/24  0411   TSH 3.46      No results found for: T4.       Recent Labs   Lab 08/03/24  0411   Cholesterol 164   Triglycerides 99   HDL 64   LDL Calculated 82             Recent Results (from the past 360 hours)   COVID-19 and Influenza ([place]) (symptomatic)    Collection Time: 08/02/24 11:29 AM    Specimen: Anterior Nares; Swab   Result Value    SARS-CoV-2 (COVID-19) RNA Not Detected    Influenza A RNA Not Detected    Influenza B RNA Not Detected     Procedures/Radiology performed:   MRI brain without  contrast  Result Date: 08/02/2024   No acute intracranial abnormality. Mild diffuse cerebral volume loss and chronic small vessel ischemic changes. [name], MD 08/02/2024 6:09 PM    XR Chest  AP Portable  Result Date: 08/02/2024  No acute cardiopulmonary abnormality. [name], MD 08/02/2024 11:47 AM    CT Head WO Contrast  Result Date: 08/02/2024   1. No CT evidence of acute intracranial hemorrhage, herniation or hydrocephalus.   2. Cerebral volume loss and sequela of chronic ischemic disease. [name], MD 08/02/2024 11:28 AM          ECHOCARDIOGRAM   Echo Results       None  Hospital Course:   Reason for admission/ HPI: Carolyn [name] is a 83 y.o. female with a PMHx of HLD, OA, admitted 08/02/2024 to observation for episode of short-term memory loss - did not recall having PT appointment, could not recall the month, did not recall buying a new car last week.    Hospital Course:   On ED arrival, VSS.  Labs notable for: grossly unremarkable CBC, CMP, Mg, Phos, TSH, COVID/flu, and UA.  EKG showed NSR with sinus arrhythmia.  CT head showed no acute abnormality.  CXR showed no acute process.  A stroke alert was called in the ED and she was evaluated by the stroke team who suspects TGA, recommended MRI brain to r/o stroke and routine EEG.  She was admitted to observation.  She was started on empiric ASA.  MRI brain showed no acute intracranial abnormality.  Stroke team reviewed MRI and signed off, presentation most suspicious for TGA.  Routine EEG showed no evidence of epileptiform activity or focal slowing.  Patient's mental status at baseline with memory recall intact.  Will plan for discharge home, no new medications upon [place].    Should f/u with PCP in 3-5 days.    Currently the patient is hemodynamically stable for discharge with outpatient follow up as outlined below.    Discharge Condition & Coordination:   Coordination  Updated patient with discharge plan, all questions  answered    Emergency Contact  Extended Emergency Contact Information  Primary Emergency Contact: Knisely,[place]   [place]  Home Phone: [phone]  Mobile Phone: [phone]  Relation: Spouse  Interpreter needed? No    Discharge Day Exam:     Temp:  [97.8 F (36.6 C)-98.5 F (36.9 C)] 98.1 F (36.7 C)  Heart Rate:  [68-106] 74  Resp Rate:  [16-18] 18  BP: (132-177)/(65-92) 151/65  General: awake, alert, oriented x 3; no acute distress  HEENT: anicteric sclerae, EOMI; MMM  Cardiovascular: regular rate and rhythm  Lungs: clear to auscultation bilaterally  Abdomen: soft, non-distended, non-tender to palpation  Extremities: warm; no LE edema  Neuro: symmetric facial movements, clear speech, moving all extremities, no weakness, sensation intact    Pending Results, Recommendations & Instructions to providers after discharge:   Micro / Labs / Path pending:   Unresulted Labs       None          Discharge Instructions:   Most Recent Diet Order:  Orders Placed This Encounter   Procedures    Adult diet Regular       Activity/Weight Bearing Status: Activity as tolerated    Instructions for after your discharge:  -- Schedule a follow up visit with your primary care provider - within 1 week of discharge from the hospital.  -- Resume your regular home medications, no changes were made.  -- You may resume your previous diet and activity as tolerated.  -- Return to the nearest emergency department for sudden one sided numbness and/or weakness, severe headache, new confusion, trouble speaking, difficulty walking, facial droop, or any other concerning symptoms    Discharge References/Attachments    None         Complete instructions and follow up are in the patient's After Visit Summary    Minutes spent coordinating discharge and reviewing discharge plan:37 minutes     Follow-up Information       Ilah Benton BRAVO, MD. Call in 1 week(s).    Specialty: Internal Medicine  Contact information:  [phone]  [address]  [place]  [place] [address]  [phone]                                Signed by: Asberry LITTIE Provost, FNP  Discussed in detail with my attending provider: Craven Setters, MD    [facility]  ADULT OBSERVATION UNIT [phone] F: [phone]  [facility]  Department of Medicine  P: [phone]  F: [phone]    CC: Ilah Benton BRAVO, MD  Please see attending note that follows this mid-level encounter note.       Attending Attestation:     I have seen and examined the patient with the APP.  I have discussed this patient with APP, Provost and together we have formulated the Assessment & Plan that is clearly dictated on this note by the APP.  I have edited when necessary.  I have performed the substantive Medical Decision-Making portion of this encounter.       Setters Craven, MD MPH       I have personally performed the substantive portion of this visit by conducting a face to face encounter, ordering and reviewing labs/imaging studies, discussing plan of care with consultants and other care team members, and performing medical decision making. A total of 35 minutes was spent.

## 2024-08-03 NOTE — Progress Notes (Signed)
 08/03/24 1152   Medicare Checklist   Is this a Medicare patient? Yes   Patient received 1st IMM Letter? n/a   3 midnight inpatient qualifying stay (SNF only) No   If LOS 3 days or greater, did patient received 2nd IMM Letter? n/a   Discharge Disposition   Patient preference/choice provided? N/A   Physical Discharge Disposition Home   Mode of Transportation Car   Patient/Family/POA notified of transfer plan Patient informed only   CM Interventions   Follow up appointment scheduled?(For PNA, COPD, MI) No   Reason no follow up scheduled? Family to schedule   Referral made for home health RN visit? Does not meet home bound criteria

## 2024-08-03 NOTE — Progress Notes (Deleted)
 *STUDENT NOTE*  **This is for practice purposes only, please see attending provider notes**       Date Time  08/03/24 12:06 PM   Patient Name  Carolyn [name], Carolyn [name]   DOB  [birth year]   MRN  [id number]   Adult Observation Unit  P203/P203.01   Attending Physician  No att. providers found     Primary Care Physician: Ilah Benton BRAVO, MD       History of Presenting Illness:   Carolyn [name] is a 83 y.o. female has a past medical history of Abnormal Pap smear, Dry eyes, Hyperlipidemia, Low back pain, Osteoarthritis, and Pelvic mass in female (07/05/2014).     She presents to the hospital with acute onset short-term memory loss. Neurology consulted in the ED. CT head negative. Brain MRI negative for stroke. EEG negative.     LABS: U/A negative, CMP, CBC, A1c 5.4, TSH 3.46. Resp. Panel negative.      #Acute onset short-term memory loss. Resolved  #TGA   - Continue ASA 81mg   - Patient returned to baseline.   - CT head and Brain MRI negative.   - Follow-up with PCP as OP in one week.   - Continue PTA medications.       #HLD  -continue statin.          Interpreter: no, not indicated      Past Medical History:   Medical History[1]  Available old records reviewed.   Past Surgical History:   Past Surgical History[2]  Family History:   Family History[3]  Social History:   Social History[4]  Allergies:   Allergies[5]  Medications:   Prescriptions Prior to Admission[6]  Review of Systems:   Review of Systems   Constitutional: Negative.    HENT: Negative.     Eyes: Negative.    Respiratory: Negative.     Cardiovascular: Negative.    Gastrointestinal: Negative.    Genitourinary: Negative.    Musculoskeletal: Negative.    Skin: Negative.    Neurological: Negative.    Endo/Heme/Allergies: Negative.    Psychiatric/Behavioral: Negative.       All other systems were reviewed and negative  Physical Exam:   BP 151/65   Pulse 74   Temp 98.1 F (36.7 C) (Oral)   Resp 18   Ht 1.6 m (5' 2.99)   Wt 63.5 kg (140 lb)   LMP 01/19/1999   SpO2  99%   BMI 24.81 kg/m   Body mass index is 24.81 kg/m.      09/21/2018 07/06/2019 08/02/2024   Weight Monitoring   Height 162.6 cm 162.6 cm    162.6 cm 160 cm    160 cm   Height Method Stated Stated    Stated Stated   Weight 66.6 kg 64.411 kg    66.6 kg 63.504 kg    63.504 kg   Weight Method Actual Stated    Stated Stated   BMI (calculated) 25.2 kg/m2  24.4 kg/m2     25.3 kg/m2  24.8 kg/m2    24.8 kg/m2       Data saved with a previous flowsheet row definition    Multiple values from one day are sorted in reverse-chronological order            Physical Exam  Constitutional:       Appearance: Normal appearance.   HENT:      Head: Normocephalic.   Cardiovascular:      Rate and Rhythm: Normal rate and regular rhythm.  Pulmonary:      Effort: Pulmonary effort is normal.   Neurological:      Mental Status: She is alert and oriented to person, place, and time.   Psychiatric:         Mood and Affect: Mood normal.         Behavior: Behavior normal.         Labs/Imaging:   I have Personally Reviewed   Results for orders placed or performed during the hospital encounter of 08/02/24 (from the past 24 hours)   Urinalysis with Reflex to Microscopic Exam and Culture    Collection Time: 08/02/24 12:32 PM    Specimen: Urine, Clean Catch   Result Value    Urine Color Colorless    Urine Clarity Clear    Urine Specific Gravity 1.007    Urine pH 6.5    Urine Leukocyte Esterase Negative    Urine Nitrite Negative    Urine Protein Negative    Urine Glucose Negative    Urine Ketones Negative    Urine Urobilinogen Normal    Urine Bilirubin Negative    Urine Blood Negative   Urine Elnor Culture Hold Tube    Collection Time: 08/02/24 12:32 PM   Result Value    Extra Tube Hold for add-ons.   Comprehensive Metabolic Panel    Collection Time: 08/02/24  2:19 PM   Result Value    Glucose 84    BUN 13    Creatinine 0.8    Sodium 142    Potassium 4.4    Chloride 110    CO2 21    Calcium 9.7    Anion Gap 11.0    GFR >60.0    AST (SGOT) 33    ALT 18     Alkaline Phosphatase 65    Albumin 4.2    Protein, Total 7.2    Globulin 3.0    Albumin/Globulin Ratio 1.4    Bilirubin, Total 0.6   CBC with Differential (Component)    Collection Time: 08/02/24  2:19 PM   Result Value    WBC 7.60    Hemoglobin 13.7    Hematocrit 42.4    Platelet Count 237    MPV 12.1    RBC 4.53    MCV 93.6    MCH 30.2    MCHC 32.3    RDW 12    nRBC % 0.0    Absolute nRBC 0.00    Preliminary Absolute Neutrophil Count 5.33    Neutrophils % 70.1    Lymphocytes % 21.6    Monocytes % 6.8    Eosinophils % 0.5    Basophils % 0.7    Immature Granulocytes % 0.3    Absolute Neutrophils 5.33    Absolute Lymphocytes 1.64    Absolute Monocytes 0.52    Absolute Eosinophils 0.04    Absolute Basophils 0.05    Absolute Immature Granulocytes 0.02   Lipid Panel    Collection Time: 08/03/24  4:11 AM   Result Value    Cholesterol 164    Triglycerides 99    HDL 64    LDL Calculated 82    VLDL Calculated 15    Cholesterol / HDL Ratio 2.6   Renal Function Panel    Collection Time: 08/03/24  4:11 AM   Result Value    Glucose 90    Sodium 141    Potassium 3.9    Chloride 110    CO2 23    BUN  16    Calcium 9.1    Creatinine 0.9    Albumin 3.6    Phosphorus 4.3    Anion Gap 8.0    GFR >60.0   Hemoglobin A1C    Collection Time: 08/03/24  4:11 AM   Result Value    Hemoglobin A1C 5.4    Average Estimated Glucose 108.3   Thyroid Stimulating Hormone (TSH) with Reflex to Free T4    Collection Time: 08/03/24  4:11 AM   Result Value    TSH 3.46   CBC without Differential    Collection Time: 08/03/24  4:11 AM   Result Value    WBC 6.68    Hemoglobin 11.9    Hematocrit 36.5    Platelet Count 214    MPV 12.4    RBC 3.91    MCV 93.4    MCH 30.4    MCHC 32.6    RDW 13    nRBC % 0.0    Absolute nRBC 0.00   APTT    Collection Time: 08/03/24  4:11 AM   Result Value    PTT 29   PT/INR    Collection Time: 08/03/24  4:11 AM   Result Value    PT 12.9    INR 1.1   Urine Drugs of Abuse Screen    Collection Time: 08/03/24  6:13 AM   Result  Value    Urine Amphetamine Screen Negative    Urine Barbituate Screen Negative    Urine Benzodiazepine Screen Negative    Urine Cannabinoid Screen Negative    Urine Cocaine Screen Negative    Urine Fentanyl  Screen Negative    Urine Opiate Screen Negative    Urine PCP Screen Negative     ECG: normal sinus rhythm, no blocks or conduction defects, no ischemic changes.   Radiology: MRI brain without contrast  Result Date: 08/02/2024   No acute intracranial abnormality. Mild diffuse cerebral volume loss and chronic small vessel ischemic changes. [name], MD 08/02/2024 6:09 PM      ASSESSMENT/PLAN:   1. Discharge patient to home with PTA medication          DVT ppx: LOVENOX  Code: FULL CODE  Diet: Regular Diet                                                                                Please See attending internal medicine note that follows this mid-level encounter note  Disposition:   Today's date: 08/03/2024  Admit Date: 08/02/2024 11:20 AM  Anticipated medical stability for discharge:Today  Service status: Observation:patient is stable and improving.  Reason for ongoing hospitalization: Discharge  Anticipated discharge needs: None      Patient Education/Discharge Instructions:       I have discussed with Attending: No att. providers found  Reonna Finlayson [place], RN, NP Student  Adult Observation Unit Nurse Practitioner  [phone] or [phone] on [phone]         [1]   Past Medical History:  Diagnosis Date    Abnormal Pap smear     >20 years ago, colpo, NL since    Dry eyes     drops daily    Hyperlipidemia  controlled w/ medication    Low back pain     Osteoarthritis     back, knees, hands    Pelvic mass in female 07/05/2014    Pelvic MRI 2015 Stable exam. 2. Simple cyst in the left mesorectal fat. 3. Proteinaceous cyst in the lower vagina representing a cyst of either Skenes duct or Bartholins gland duct. 4. Submucosal fibroid.    [2]   Past Surgical History:  Procedure Laterality Date    BREAST SURGERY  01/04/2009    Left  Stereotactic Biopsy     BUNIONECTOMY Left 06/2016    COLONOSCOPY, DIAGNOSTIC (SCREENING)  2014    COLPOSCOPY      >20 years ago     DILATION AND CURETTAGE OF UTERUS  10/2013    HIP SURGERY  05/2024    JOINT REPLACEMENT Right 06/2017    knee    LAMINECTOMY, POSTERIOR LUMBAR, DECOMPRESSION, FUSION LEVEL 2 N/A 09/21/2018    Procedure: LAMINECTOMY, POSTERIOR LUMBAR, DECOMPRESSION, FUSION LEVEL 2;  Surgeon: Karolynn Tanda BROCKS, MD;  Location: [facility];  Service: Neurosurgery;  Laterality: N/A;  L4-S1 LAMINECTOMY AND FUSION   [3]   Family History  Problem Relation Name Age of Onset    Skin cancer Maternal Grandfather      Breast cancer Neg Hx      Ovarian cancer Neg Hx      Cancer Neg Hx     [4]   Social History  Socioeconomic History    Marital status: Married   Tobacco Use    Smoking status: Former     Current packs/day: 0.00     Average packs/day: 2.0 packs/day for 20.0 years (40.0 ttl pk-yrs)     Types: Cigarettes     Start date: 58     Quit date: 1989     Years since quitting: 36.8    Smokeless tobacco: Never   Vaping Use    Vaping status: Never Used   Substance and Sexual Activity    Alcohol use: Yes     Alcohol/week: 7.0 standard drinks of alcohol     Types: 7 Glasses of wine per week    Drug use: No    Sexual activity: Yes     Partners: Male   [5] No Known Allergies  [6]   No medications prior to admission.
